# Patient Record
Sex: Female | Born: 1980
Health system: Southern US, Community
[De-identification: ages and names within clinical notes are randomized; demographics above are authoritative.]

## PROBLEM LIST (undated history)

## (undated) DIAGNOSIS — K643 Fourth degree hemorrhoids: Secondary | ICD-10-CM

## (undated) DIAGNOSIS — K5909 Other constipation: Secondary | ICD-10-CM

## (undated) DIAGNOSIS — G43909 Migraine, unspecified, not intractable, without status migrainosus: Secondary | ICD-10-CM

## (undated) DIAGNOSIS — Z9889 Other specified postprocedural states: Secondary | ICD-10-CM

## (undated) DIAGNOSIS — Z8489 Family history of other specified conditions: Secondary | ICD-10-CM

## (undated) DIAGNOSIS — E282 Polycystic ovarian syndrome: Secondary | ICD-10-CM

## (undated) DIAGNOSIS — R112 Nausea with vomiting, unspecified: Secondary | ICD-10-CM

## (undated) DIAGNOSIS — E785 Hyperlipidemia, unspecified: Secondary | ICD-10-CM

## (undated) HISTORY — PX: TONSILLECTOMY AND ADENOIDECTOMY: SUR1326

---

## 2001-10-10 HISTORY — PX: DILATION AND CURETTAGE OF UTERUS: SHX78

## 2008-10-10 HISTORY — PX: WISDOM TOOTH EXTRACTION: SHX21

## 2017-06-01 ENCOUNTER — Emergency Department
Admission: EM | Admit: 2017-06-01 | Discharge: 2017-06-01 | Disposition: A | Discharge status: Home / Self Care | Payer: 59 | Source: Home / Self Care | Attending: Family Medicine | Admitting: Family Medicine

## 2017-06-01 ENCOUNTER — Encounter: Payer: Self-pay | Admitting: Emergency Medicine

## 2017-06-01 DIAGNOSIS — L0201 Cutaneous abscess of face: Secondary | ICD-10-CM

## 2017-06-01 MED ORDER — DOXYCYCLINE HYCLATE 100 MG PO CAPS: 100.0000 mg | ORAL_CAPSULE | Freq: Two times a day (BID) | ORAL | 0 refills | Status: DC

## 2017-06-01 NOTE — ED Triage Notes (Signed)
Large acne on right side of chin x 1 month, took 10 days of Keflex about 3 weeks ago.

## 2017-06-01 NOTE — Discharge Instructions (Signed)
Leave bandage in place until follow-up visit tomorrow.  Keep wound clean and dry.

## 2017-06-01 NOTE — ED Provider Notes (Signed)
[facility]    CSN: [id number] Arrival date & time: 06/01/17  1854     History   Chief Complaint Chief Complaint  Patient presents with  . Acne    HPI Debra Rivas is a 36 y.o. female.   Patient reports that she developed an acne-like bump on her right chin about a month ago that gradually increased in size.  She had some left-over clindamycin 300mg , and took one twice daily for one week.  The lesion improved somewhat and drained purulent fluid but persisted.  She then visited a [facility] about 3 weeks ago where she was prescribed a 10 day course of Keflex 500mg  BID and topical mupirocin.  The lesion again drained purulent fluid but again increased in size.  She has continued to apply mupirocin ointment.  She feels well.  No fevers, chills, and sweats.   The history is provided by the patient.  Abscess  Location:  Face Facial abscess location:  Chin Size:  1.5cm Abscess quality: fluctuance and redness   Duration:  1 month Progression:  Unchanged Chronicity:  New Context: not skin injury   Relieved by:  Nothing Worsened by:  Draining/squeezing Ineffective treatments:  Draining/squeezing, oral antibiotics and topical antibiotics Associated symptoms: no fatigue and no fever     History reviewed. No pertinent past medical history.  There are no active problems to display for this patient.   History reviewed. No pertinent surgical history.  OB History    No data available       Home Medications    Prior to Admission medications   Medication Sig Start Date End Date Taking? Authorizing Provider  doxycycline (VIBRAMYCIN) 100 MG capsule Take 1 capsule (100 mg total) by mouth 2 (two) times daily. Take with food. 06/01/17   Lattie Haw, MD    Family History History reviewed. No pertinent family history.  Social History Social History  Substance Use Topics  . Smoking status: Never Smoker  . Smokeless tobacco: Never Used  . Alcohol use Yes      Allergies   Patient has no allergy information on record.   Review of Systems Review of Systems  Constitutional: Negative for fatigue and fever.  All other systems reviewed and are negative.    Physical Exam Triage Vital Signs ED Triage Vitals  Enc Vitals Group     BP 06/01/17 1911 121/81     Pulse Rate 06/01/17 1911 76     Resp --      Temp 06/01/17 1911 97.6 F (36.4 C)     Temp Source 06/01/17 1911 Oral     SpO2 06/01/17 1911 97 %     Weight 06/01/17 1912 130 lb (59 kg)     Height 06/01/17 1912 4\' 11"  (1.499 m)     Head Circumference --      Peak Flow --      Pain Score 06/01/17 1912 5     Pain Loc --      Pain Edu? --      Excl. in GC? --    No data found.   Updated Vital Signs BP 121/81 (BP Location: Left Arm)   Pulse 76   Temp 97.6 F (36.4 C) (Oral)   Ht 4\' 11"  (1.499 m)   Wt 130 lb (59 kg)   LMP 05/12/2017 (Exact Date)   SpO2 97%   BMI 26.26 kg/m   Visual Acuity Right Eye Distance:   Left Eye Distance:   Bilateral Distance:  Right Eye Near:   Left Eye Near:    Bilateral Near:     Physical Exam  Constitutional: She appears well-developed and well-nourished. No distress.  HENT:  Head: Atraumatic.    Right Ear: External ear normal.  Left Ear: External ear normal.  Nose: Nose normal.  Mouth/Throat: Oropharynx is clear and moist.  Right chin has 1.5cm by 1cm purpuric appearing cystic lesion, fluctuant but with minimal tenderness to palpation.  Eyes: Pupils are equal, round, and reactive to light. Conjunctivae are normal.  Neck: Neck supple.  Cardiovascular: Normal rate.   Pulmonary/Chest: Effort normal.  Lymphadenopathy:    She has no cervical adenopathy.  Neurological: She is alert.  Skin: Skin is warm and dry.  Nursing note and vitals reviewed.    UC Treatments / Results  Labs (all labs ordered are listed, but only abnormal results are displayed) Labs Reviewed  WOUND CULTURE    EKG  EKG Interpretation None        Radiology No results found.  Procedures Procedures  Incise and drain cyst/abscess Risks and benefits of procedure explained to patient and verbal consent obtained.  Using sterile technique, applied topical refrigerant spray followed by local anesthesia with 1% lidocaine with epinephrine, and cleansed affected area with Betadine and alcohol. Identified the most fluctuant area of lesion and incised with #11 blade.  Expressed blood and small amount of purulent material.  Inserted small length of Iodoform gauze packing to maintain wound patency.  Bandage applied.  Patient tolerated well   Medications Ordered in UC Medications - No data to display   Initial Impression / Assessment and Plan / UC Course  I have reviewed the triage vital signs and the nursing notes.  Pertinent labs & imaging results that were available during my care of the patient were reviewed by me and considered in my medical decision making (see chart for details).    Wound culture pending.  Begin empiric doxycycline 100mg  BID. Leave bandage in place until follow-up visit tomorrow.  Keep wound clean and dry.    Final Clinical Impressions(s) / UC Diagnoses   Final diagnoses:  Abscess of chin    New Prescriptions New Prescriptions   DOXYCYCLINE (VIBRAMYCIN) 100 MG CAPSULE    Take 1 capsule (100 mg total) by mouth 2 (two) times daily. Take with food.         Lattie Haw, MD 06/01/17 [phone]

## 2017-06-02 ENCOUNTER — Emergency Department (INDEPENDENT_AMBULATORY_CARE_PROVIDER_SITE_OTHER)
Admission: EM | Admit: 2017-06-02 | Discharge: 2017-06-02 | Disposition: A | Discharge status: Home / Self Care | Payer: 59 | Source: Home / Self Care | Attending: Family Medicine | Admitting: Family Medicine

## 2017-06-02 ENCOUNTER — Encounter: Payer: Self-pay | Admitting: Emergency Medicine

## 2017-06-02 DIAGNOSIS — Z5189 Encounter for other specified aftercare: Secondary | ICD-10-CM

## 2017-06-02 NOTE — ED Triage Notes (Signed)
Pt here for f/u of wound on right side of chin. Seen yesterday. No complaints. Taking abx as prescribed.

## 2017-06-02 NOTE — Discharge Instructions (Signed)
Keep wound bandaged and apply mupirocin ointment for 3 to 4 days.

## 2017-06-02 NOTE — ED Provider Notes (Signed)
[facility]    CSN: [id number] Arrival date & time: 06/02/17  1555     History   Chief Complaint Chief Complaint  Patient presents with  . Wound Check    HPI Debra Rivas is a 36 y.o. female.   Patient returns for dressing change of I and D site right chin.  She reports no pain.   The history is provided by the patient.    History reviewed. No pertinent past medical history.  There are no active problems to display for this patient.   History reviewed. No pertinent surgical history.  OB History    No data available       Home Medications    Prior to Admission medications   Medication Sig Start Date End Date Taking? Authorizing Provider  doxycycline (VIBRAMYCIN) 100 MG capsule Take 1 capsule (100 mg total) by mouth 2 (two) times daily. Take with food. 06/01/17   Lattie Haw, MD    Family History History reviewed. No pertinent family history.  Social History Social History  Substance Use Topics  . Smoking status: Never Smoker  . Smokeless tobacco: Never Used  . Alcohol use Yes     Allergies   Patient has no allergy information on record.   Review of Systems Review of Systems No fevers, chills, and sweats.   Physical Exam Triage Vital Signs ED Triage Vitals [06/02/17 1703]  Enc Vitals Group     BP      Pulse Rate 73     Resp      Temp 97.8 F (36.6 C)     Temp Source Oral     SpO2 98 %     Weight      Height      Head Circumference      Peak Flow      Pain Score 0     Pain Loc      Pain Edu?      Excl. in GC?    No data found.   Updated Vital Signs Pulse 73   Temp 97.8 F (36.6 C) (Oral)   LMP 05/12/2017 (Exact Date)   SpO2 98%   Visual Acuity Right Eye Distance:   Left Eye Distance:   Bilateral Distance:    Right Eye Near:   Left Eye Near:    Bilateral Near:     Physical Exam Nursing notes and Vital Signs reviewed. Appearance:  Patient appears stated age, and in no acute distress I and D site  right chin has minimal swelling and tenderness.  Packing removed; minimal drainage present. Minimal erythema.  No further packing indicated.  Dressing applied.  UC Treatments / Results  Labs (all labs ordered are listed, but only abnormal results are displayed) Labs Reviewed - No data to display  EKG  EKG Interpretation None       Radiology No results found.  Procedures Procedures (including critical care time)  Medications Ordered in UC Medications - No data to display   Initial Impression / Assessment and Plan / UC Course  I have reviewed the triage vital signs and the nursing notes.  Pertinent labs & imaging results that were available during my care of the patient were reviewed by me and considered in my medical decision making (see chart for details).    Wound healing well. Keep wound bandaged and apply mupirocin ointment for 3 to 4 days. Continue doxycycline. Followup with dermatologist or plastic surgeon if area remains nodular after healing.  Final Clinical Impressions(s) / UC Diagnoses   Final diagnoses:  Visit for wound check    New Prescriptions New Prescriptions   No medications on file       Lattie Haw, MD 06/05/17 [phone]

## 2017-06-03 ENCOUNTER — Telehealth: Payer: Self-pay | Admitting: Emergency Medicine

## 2017-06-03 NOTE — Telephone Encounter (Signed)
Spoke with patient states that she is doing well since her appointment, no problems or issues.  Any further questions or concerns, will follow up with PCP.

## 2017-06-04 LAB — WOUND CULTURE
GRAM STAIN: NONE SEEN
Gram Stain: NONE SEEN
Organism ID, Bacteria: NO GROWTH

## 2020-06-11 DIAGNOSIS — G43009 Migraine without aura, not intractable, without status migrainosus: Secondary | ICD-10-CM | POA: Diagnosis not present

## 2020-08-17 DIAGNOSIS — M25572 Pain in left ankle and joints of left foot: Secondary | ICD-10-CM | POA: Diagnosis not present

## 2020-08-17 DIAGNOSIS — M545 Low back pain, unspecified: Secondary | ICD-10-CM | POA: Diagnosis not present

## 2020-08-17 DIAGNOSIS — Y999 Unspecified external cause status: Secondary | ICD-10-CM | POA: Diagnosis not present

## 2020-08-17 DIAGNOSIS — M25462 Effusion, left knee: Secondary | ICD-10-CM | POA: Diagnosis not present

## 2020-08-17 DIAGNOSIS — M79602 Pain in left arm: Secondary | ICD-10-CM | POA: Diagnosis not present

## 2020-08-17 DIAGNOSIS — M79601 Pain in right arm: Secondary | ICD-10-CM | POA: Diagnosis not present

## 2020-08-17 DIAGNOSIS — S99912A Unspecified injury of left ankle, initial encounter: Secondary | ICD-10-CM | POA: Diagnosis not present

## 2020-09-02 DIAGNOSIS — S93402D Sprain of unspecified ligament of left ankle, subsequent encounter: Secondary | ICD-10-CM | POA: Diagnosis not present

## 2020-09-10 DIAGNOSIS — M25572 Pain in left ankle and joints of left foot: Secondary | ICD-10-CM | POA: Diagnosis not present

## 2020-11-09 DIAGNOSIS — Z13220 Encounter for screening for lipoid disorders: Secondary | ICD-10-CM | POA: Diagnosis not present

## 2020-11-09 DIAGNOSIS — Z0000 Encounter for general adult medical examination without abnormal findings: Secondary | ICD-10-CM | POA: Diagnosis not present

## 2020-11-09 DIAGNOSIS — Z131 Encounter for screening for diabetes mellitus: Secondary | ICD-10-CM | POA: Diagnosis not present

## 2020-12-21 DIAGNOSIS — K648 Other hemorrhoids: Secondary | ICD-10-CM | POA: Diagnosis not present

## 2020-12-21 DIAGNOSIS — K625 Hemorrhage of anus and rectum: Secondary | ICD-10-CM | POA: Diagnosis not present

## 2021-02-25 DIAGNOSIS — B354 Tinea corporis: Secondary | ICD-10-CM | POA: Diagnosis not present

## 2021-02-25 DIAGNOSIS — L709 Acne, unspecified: Secondary | ICD-10-CM | POA: Diagnosis not present

## 2021-03-09 ENCOUNTER — Ambulatory Visit: Payer: Self-pay | Admitting: Surgery

## 2021-03-09 DIAGNOSIS — E785 Hyperlipidemia, unspecified: Secondary | ICD-10-CM | POA: Diagnosis not present

## 2021-03-09 DIAGNOSIS — E282 Polycystic ovarian syndrome: Secondary | ICD-10-CM | POA: Diagnosis not present

## 2021-03-09 DIAGNOSIS — K643 Fourth degree hemorrhoids: Secondary | ICD-10-CM | POA: Diagnosis not present

## 2021-03-09 DIAGNOSIS — K648 Other hemorrhoids: Secondary | ICD-10-CM | POA: Diagnosis not present

## 2021-03-09 NOTE — H&P (Signed)
Debra Rivas Appointment: 03/09/2021 8:45 AM Location: [facility] Patient #: [id number] DOB: Aug 12, 1981 Single / Language: [place] / Race: White Female  History of Present Illness Debra Sportsman MD; 03/09/2021 9:30 AM) The patient is a 40 year old female who presents with hemorrhoids. Note for "Hemorrhoids": ` ` ` Patient sent for surgical consultation at the request of Debra Rivas, [place], [facility]  Chief Complaint: Hemorrhoids ` ` The patient is a pleasant woman estrone chronic constipation most for life. Usually goes once a week. Lately been trying to use stool softeners. Goes about twice a week now. She's had hemorrhoid issues in the past with bleeding and irritation. When she lived in [place], [place], she discussed with her primary care physician in [place] gastroenterology. They did a series of 3 banding's that helped somewhat. Recalls having a flexible sigmoidoscopy done at that time that was otherwise underwhelming. She's never had a colonoscopy. I cannot find record [name] She has at least one persistent hemorrhoid that is perhaps gotten worse. Stool softer to over to constipation but still gets bleeding. She had some episodes of pain irritation. Prescription for a suppository cost almost $300, so she held off. Surgical consultation offered after seeing gastroenterology.  No personal nor family history of GI/colon cancer, inflammatory bowel disease, irritable bowel syndrome, allergy such as Celiac Sprue, dietary/dairy problems, colitis, ulcers nor gastritis. No recent sick contacts/gastroenteritis. No travel outside the country. No changes in diet. No dysphagia to solids or liquids. No significant heartburn or reflux. No melena, hematemesis, coffee ground emesis. No evidence of prior gastric/peptic ulceration. Has polycystic ovary disease otherwise stable. Hypercholesterolemia.  (Review of systems as stated in this history (HPI) or in the review  of systems. Otherwise all other 12 point ROS are negative) ` ` ###########################################`  This patient encounter took 25 minutes today to perform the following: obtain history, perform exam, review outside records, interpret tests & imaging, counsel the patient on their diagnosis; and, document this encounter, including findings & plan in the electronic health record (EHR).   Past Surgical History Debra Rivas, CMA; 03/09/2021 9:00 AM) Oral Surgery Tonsillectomy  Diagnostic Studies History (Debra Rivas, CMA; 03/09/2021 9:00 AM) Colonoscopy never Pap Smear 1-5 years ago  Allergies (Debra Rivas, CMA; 03/09/2021 8:59 AM) No Known Drug Allergies [03/09/2021]: Allergies Reconciled  Medication History (Debra Rivas, CMA; 03/09/2021 9:02 AM) Biotin (1MG  Capsule, Oral) Active. Butalbital-Acetaminophen (25-325MG  Tablet, Oral) Active. Topiramate (15MG  Cap Sprinkle, Oral) Active. Vitamin D (400UNIT Capsule, Oral) Active. Clindamycin HCl (75MG  Capsule, Oral) Active. Diflucan (50MG  Tablet, Oral) Active. Hydrocortisone (1% Cream, External) Active. Simvastatin (10MG  Tablet, Oral) Active. Cyclobenzaprine HCl (5MG  Tablet, Oral) Active. Doxycycline Hyclate (100MG  Capsule, Oral) Active.  Social History (Debra Rivas, CMA; 03/09/2021 9:00 AM) Alcohol use Occasional alcohol use. Caffeine use Coffee. No drug use Tobacco use Never smoker.  Family History (Debra Rivas, CMA; 03/09/2021 9:00 AM) Colon Polyps Father. Depression Mother. Heart Disease Father. Hypertension Father, Mother.  Pregnancy / Birth History , CMA; 03/09/2021 9:00 AM) Gravida 1 Maternal age 14-25 Para 0 Regular periods  Other Problems (Debra Rivas, CMA; 03/09/2021 9:00 AM) No pertinent past medical history     Review of Systems (Debra Rivas CMA; 03/09/2021 9:00 AM) General Not Present- Appetite Loss, Chills, Fatigue, Fever, Night Sweats, Weight Gain and  Weight Loss. Skin Not Present- Change in Wart/Mole, Dryness, Hives, Jaundice, New Lesions, Non-Healing Wounds, Rash and Ulcer. HEENT Not Present- Earache, Hearing Loss, Hoarseness, Nose Bleed, Oral Ulcers, Ringing in the Ears, Seasonal Allergies,  Sinus Pain, Sore Throat, Visual Disturbances, Wears glasses/contact lenses and Yellow Eyes. Respiratory Not Present- Bloody sputum, Chronic Cough, Difficulty Breathing, Snoring and Wheezing. Breast Not Present- Breast Mass, Breast Pain, Nipple Discharge and Skin Changes. Cardiovascular Not Present- Chest Pain, Difficulty Breathing Lying Down, Leg Cramps, Palpitations, Rapid Heart Rate, Shortness of Breath and Swelling of Extremities. Gastrointestinal Present- Hemorrhoids. Not Present- Abdominal Pain, Bloating, Bloody Stool, Change in Bowel Habits, Chronic diarrhea, Constipation, Difficulty Swallowing, Excessive gas, Gets full quickly at meals, Indigestion, Nausea, Rectal Pain and Vomiting. Female Genitourinary Not Present- Frequency, Nocturia, Painful Urination, Pelvic Pain and Urgency. Musculoskeletal Present- Joint Pain. Not Present- Back Pain, Joint Stiffness, Muscle Pain, Muscle Weakness and Swelling of Extremities. Neurological Not Present- Decreased Memory, Fainting, Headaches, Numbness, Seizures, Tingling, Tremor, Trouble walking and Weakness. Psychiatric Not Present- Anxiety, Bipolar, Change in Sleep Pattern, Depression, Fearful and Frequent crying. Endocrine Not Present- Cold Intolerance, Excessive Hunger, Hair Changes, Heat Intolerance, Hot flashes and New Diabetes. Hematology Not Present- Blood Thinners, Easy Bruising, Excessive bleeding, Gland problems, HIV and Persistent Infections.  Vitals (Debra Rivas CMA; 03/09/2021 8:59 AM) 03/09/2021 8:58 AM Weight: 142.25 lb Height: 58.5in Body Surface Area: 1.59 m Body Mass Index: 29.22 kg/m  Temp.: 98.89F  Pulse: 99 (Regular)  P.OX: 99% (Room air) BP: 112/70(Sitting, Left Arm,  Standard)        Physical Exam Debra Sportsman MD; 03/09/2021 9:18 AM)  General Mental Status-Alert. General Appearance-Not in acute distress, Not Sickly. Orientation-Oriented X3. Hydration-Well hydrated. Voice-Normal.  Integumentary Global Assessment Upon inspection and palpation of skin surfaces of the - Axillae: non-tender, no inflammation or ulceration, no drainage. and Distribution of scalp and body hair is normal. General Characteristics Temperature - normal warmth is noted.  Head and Neck Head-normocephalic, atraumatic with no lesions or palpable masses. Face Global Assessment - atraumatic, no absence of expression. Neck Global Assessment - no abnormal movements, no bruit auscultated on the right, no bruit auscultated on the left, no decreased range of motion, non-tender. Trachea-midline. Thyroid Gland Characteristics - non-tender.  Eye Eyeball - Left-Extraocular movements intact, No Nystagmus - Left. Eyeball - Right-Extraocular movements intact, No Nystagmus - Right. Cornea - Left-No Hazy - Left. Cornea - Right-No Hazy - Right. Sclera/Conjunctiva - Left-No scleral icterus, No Discharge - Left. Sclera/Conjunctiva - Right-No scleral icterus, No Discharge - Right. Pupil - Left-Direct reaction to light normal. Pupil - Right-Direct reaction to light normal.  ENMT Ears Pinna - Left - no drainage observed, no generalized tenderness observed. Pinna - Right - no drainage observed, no generalized tenderness observed. Nose and Sinuses External Inspection of the Nose - no destructive lesion observed. Inspection of the nares - Left - quiet respiration. Inspection of the nares - Right - quiet respiration. Mouth and Throat Lips - Upper Lip - no fissures observed, no pallor noted. Lower Lip - no fissures observed, no pallor noted. Nasopharynx - no discharge present. Oral Cavity/Oropharynx - Tongue - no dryness observed. Oral Mucosa - no cyanosis  observed. Hypopharynx - no evidence of airway distress observed. Note: Moderate ear stud piercing. Clean without inflammation or irritation  Chest and Lung Exam Inspection Movements - Normal and Symmetrical. Accessory muscles - No use of accessory muscles in breathing. Palpation Palpation of the chest reveals - Non-tender. Auscultation Breath sounds - Normal and Clear.  Cardiovascular Auscultation Rhythm - Regular. Murmurs & Other Heart Sounds - Auscultation of the heart reveals - No Murmurs and No Systolic Clicks.  Abdomen Inspection Inspection of the abdomen reveals - No Visible peristalsis  and No Abnormal pulsations. Umbilicus - No Bleeding, No Urine drainage. Palpation/Percussion Palpation and Percussion of the abdomen reveal - Soft, Non Tender, No Rebound tenderness, No Rigidity (guarding) and No Cutaneous hyperesthesia. Note: Abdomen soft. Not severely distended. No distasis recti. No umbilical or other anterior abdominal wall hernias  Female Genitourinary Sexual Maturity Tanner 5 - Adult hair pattern. Note: No vaginal bleeding nor discharge  Rectal Note: #################################  Large right anterior prolapsed hemorrhoid  Perianal skin clean with good hygiene. No pruritis ani. No pilonidal disease. No fissure. No abscess/fistula. Normal sphincter tone. No other external hemorrhoids. No condyloma warts.  Tolerates digital and anoscopic rectal exam. No rectal masses. Hemorrhoidal piles right posterior with scarring consistent with prior banding. Left lateral mildly irritated grade 2. Exam done with assistance of female Medical Assistant in the room.  Peripheral Vascular Upper Extremity Inspection - Left - No Cyanotic nailbeds - Left, Not Ischemic. Inspection - Right - No Cyanotic nailbeds - Right, Not Ischemic.  Neurologic Neurologic evaluation reveals -normal attention span and ability to concentrate, able to name objects and repeat phrases.  Appropriate fund of knowledge , normal sensation and normal coordination. Mental Status Affect - not angry, not paranoid. Cranial Nerves-Normal Bilaterally. Gait-Normal.  Neuropsychiatric Mental status exam performed with findings of-able to articulate well with normal speech/language, rate, volume and coherence, thought content normal with ability to perform basic computations and apply abstract reasoning and no evidence of hallucinations, delusions, obsessions or homicidal/suicidal ideation.  Musculoskeletal Global Assessment Spine, Ribs and Pelvis - no instability, subluxation or laxity. Right Upper Extremity - no instability, subluxation or laxity.  Lymphatic Head & Neck  General Head & Neck Lymphatics: Bilateral - Description - No Localized lymphadenopathy. Axillary  General Axillary Region: Bilateral - Description - No Localized lymphadenopathy. Femoral & Inguinal  Generalized Femoral & Inguinal Lymphatics: Left - Description - No Localized lymphadenopathy. Right - Description - No Localized lymphadenopathy.    Assessment & Plan Debra Sportsman MD; 03/09/2021 9:30 AM)  PROLAPSED INTERNAL HEMORRHOIDS, GRADE 4 (K64.3) Impression: Large right anterior chronically prolapsed hemorrhoid with bleeding and irritation in the setting of chronic constipation.  She's already had a few Band-Aids done last decade. I think that reflects her right posterior scarred in area. This too far along to manage with banding alone. I think she requires surgical treatment with hemorrhoidectomy and ligation and pexy overall. Outpatient surgery. She is interested in proceeding. I did caution him that people struggle with severe pain in the first few weeks and then it starts to taper off after a few months. She understands and wishes to proceed.  I strongly noted that this is a side effect of her chronic constipation and she needs to be in the supplement every day and adjust so that she is going every  day to minimize the chance of new hemorrhoids and also help her recover over the surgery more easily. Previous. We give information to help  Current Plans ANOSCOPY, DIAGNOSTIC (00174)  INTERNAL BLEEDING HEMORRHOIDS (K64.8) Impression: The anatomy & physiology of the anorectal region was discussed. The pathophysiology of hemorrhoids and differential diagnosis was discussed. Natural history progression was discussed. I stressed the importance of a bowel regimen to have daily soft bowel movements to minimize progression of disease. Goal of one BM / day ideal. Use of wet wipes, warm baths, avoiding straining, etc were emphasized.  Educational handouts further explaining the pathology, treatment options, and bowel regimen were given as well. The patient expressed understanding.  Current Plans Pt  Education - Pamphlet Given - The Hemorrhoid Book: discussed with patient and provided information. The anatomy & physiology of the anorectal region was discussed. The pathophysiology of hemorrhoids and differential diagnosis was discussed. Natural history risks without surgery was discussed. I stressed the importance of a bowel regimen to have daily soft bowel movements to minimize progression of disease. Interventions such as sclerotherapy & banding were discussed.  The patient's symptoms are not adequately controlled by medicines and other non-operative treatments. I feel the risks & problems of no surgery outweigh the operative risks; therefore, I recommended surgery to treat the hemorrhoids by ligation, pexy, and possible resection.  Risks such as bleeding, infection, urinary difficulties, need for further treatment, heart attack, death, and other risks were discussed. I noted a good likelihood this will help address the problem. Goals of post-operative recovery were discussed as well. Possibility that this will not correct all symptoms was explained. Post-operative pain, bleeding, constipation,  and other problems after surgery were discussed. We will work to minimize complications. Educational handouts further explaining the pathology, treatment options, and bowel regimen were given as well. Questions were answered. The patient expresses understanding & wishes to proceed with surgery.  Pt Education - [facility] Hemorrhoids (Jaelani Posa): discussed with patient and provided information.  ENCOUNTER FOR PREOPERATIVE EXAMINATION FOR GENERAL SURGICAL PROCEDURE (Z01.818)  Current Plans You are being scheduled for surgery- Our schedulers will call you.  You should hear from our office's scheduling department within 5 working days about the location, date, and time of surgery. We try to make accommodations for patient's preferences in scheduling surgery, but sometimes the OR schedule or the surgeon's schedule prevents us from making those accommodations.  If you have not heard from our office [phone]) in 5 working days, call the office and ask for your surgeon's nurse.  If you have other questions about your diagnosis, plan, or surgery, call the office and ask for your surgeon's nurse.  Pt Education - [facility] Rectal Prep for Anorectal outpatient/office surgery: discussed with patient and provided information. Pt Education - [facility] Rectal Surgery HCI (Devarius Nelles): discussed with patient and provided information.  PCOS (POLYCYSTIC OVARIAN SYNDROME) (E28.2)   HYPERLIPEMIA (E78.5)  Debra SportsmanSteven C. Mailani Degroote, MD, FACS, MASCRS  Esophageal, Gastrointestinal & Colorectal Surgery Robotic and Minimally Invasive Surgery [facility] [address] [place] [id number] [phone] Fax [phone] Main/Paging  CONTACT INFORMATION: Weekday (9AM-5PM) concerns: Call [facility] main office at [phone] Weeknight (5PM-9AM) or Weekend/Holiday concerns: Check [email or web address] for General Surgery [facility] coverage (Please, do not use SecureChat as it is not reliable communication to operating  surgeons for immediate patient care)     '

## 2021-04-13 ENCOUNTER — Other Ambulatory Visit: Payer: Self-pay

## 2021-04-13 ENCOUNTER — Encounter (HOSPITAL_BASED_OUTPATIENT_CLINIC_OR_DEPARTMENT_OTHER): Payer: Self-pay | Admitting: Surgery

## 2021-04-13 NOTE — Progress Notes (Signed)
Spoke w/ via phone for pre-op interview--- Pt Lab needs dos----  Urine preg             Lab results------ no COVID test -----patient states asymptomatic no test needed Arrive at ------- 0700 on 04-15-2021 NPO after MN NO Solid Food.  Clear liquids from MN until--- 0600 Med rec completed Medications to take morning of surgery ----- NONE Diabetic medication ----- n/a Patient instructed no nail polish to be worn day of surgery Patient instructed to bring photo id and insurance card day of surgery Patient aware to have Driver (ride ) / caregiver for 24 hours after surgery -- mother, Debra Rivas Patient Special Instructions ----- per unable to come pick up ensure pre-surgery drink Pre-Op special Istructions ----- n/a Patient verbalized understanding of instructions that were given at this phone interview. Patient denies shortness of breath, chest pain, fever, cough at this phone interview.

## 2021-04-15 ENCOUNTER — Encounter (HOSPITAL_BASED_OUTPATIENT_CLINIC_OR_DEPARTMENT_OTHER)
Admission: RE | Disposition: A | Discharge status: Home / Self Care | Payer: Self-pay | Source: Home / Self Care | Attending: Surgery

## 2021-04-15 ENCOUNTER — Ambulatory Visit (HOSPITAL_BASED_OUTPATIENT_CLINIC_OR_DEPARTMENT_OTHER): Payer: BC Managed Care – PPO | Admitting: Anesthesiology

## 2021-04-15 ENCOUNTER — Encounter (HOSPITAL_BASED_OUTPATIENT_CLINIC_OR_DEPARTMENT_OTHER): Payer: Self-pay | Admitting: Surgery

## 2021-04-15 ENCOUNTER — Ambulatory Visit (HOSPITAL_BASED_OUTPATIENT_CLINIC_OR_DEPARTMENT_OTHER)
Admission: RE | Admit: 2021-04-15 | Discharge: 2021-04-15 | Disposition: A | Discharge status: Home / Self Care | Payer: BC Managed Care – PPO | Attending: Surgery | Admitting: Surgery

## 2021-04-15 ENCOUNTER — Other Ambulatory Visit: Payer: Self-pay

## 2021-04-15 DIAGNOSIS — K5909 Other constipation: Secondary | ICD-10-CM | POA: Insufficient documentation

## 2021-04-15 DIAGNOSIS — E282 Polycystic ovarian syndrome: Secondary | ICD-10-CM | POA: Insufficient documentation

## 2021-04-15 DIAGNOSIS — K641 Second degree hemorrhoids: Secondary | ICD-10-CM | POA: Diagnosis not present

## 2021-04-15 DIAGNOSIS — E7800 Pure hypercholesterolemia, unspecified: Secondary | ICD-10-CM | POA: Insufficient documentation

## 2021-04-15 DIAGNOSIS — E785 Hyperlipidemia, unspecified: Secondary | ICD-10-CM | POA: Insufficient documentation

## 2021-04-15 DIAGNOSIS — K643 Fourth degree hemorrhoids: Secondary | ICD-10-CM | POA: Insufficient documentation

## 2021-04-15 DIAGNOSIS — G43909 Migraine, unspecified, not intractable, without status migrainosus: Secondary | ICD-10-CM | POA: Diagnosis not present

## 2021-04-15 DIAGNOSIS — Z79899 Other long term (current) drug therapy: Secondary | ICD-10-CM | POA: Insufficient documentation

## 2021-04-15 DIAGNOSIS — K644 Residual hemorrhoidal skin tags: Secondary | ICD-10-CM | POA: Diagnosis not present

## 2021-04-15 HISTORY — DX: Hyperlipidemia, unspecified: E78.5

## 2021-04-15 HISTORY — DX: Family history of other specified conditions: Z84.89

## 2021-04-15 HISTORY — DX: Fourth degree hemorrhoids: K64.3

## 2021-04-15 HISTORY — DX: Polycystic ovarian syndrome: E28.2

## 2021-04-15 HISTORY — DX: Other constipation: K59.09

## 2021-04-15 HISTORY — PX: RECTAL EXAM UNDER ANESTHESIA: SHX6399

## 2021-04-15 HISTORY — DX: Other specified postprocedural states: Z98.890

## 2021-04-15 HISTORY — DX: Other specified postprocedural states: R11.2

## 2021-04-15 HISTORY — PX: HEMORRHOID SURGERY: SHX153

## 2021-04-15 HISTORY — DX: Migraine, unspecified, not intractable, without status migrainosus: G43.909

## 2021-04-15 LAB — POCT PREGNANCY, URINE: Preg Test, Ur: NEGATIVE

## 2021-04-15 SURGERY — HEMORRHOIDECTOMY PROLAPSED
Anesthesia: General | Site: Rectum

## 2021-04-15 MED ORDER — ONDANSETRON HCL 4 MG/2ML IJ SOLN
INTRAMUSCULAR | Status: AC
  Filled 2021-04-15: qty 2

## 2021-04-15 MED ORDER — ROCURONIUM BROMIDE 100 MG/10ML IV SOLN
INTRAVENOUS | Status: DC | PRN
  Administered 2021-04-15: 50 mg via INTRAVENOUS

## 2021-04-15 MED ORDER — FENTANYL CITRATE (PF) 100 MCG/2ML IJ SOLN
INTRAMUSCULAR | Status: AC
  Filled 2021-04-15: qty 2

## 2021-04-15 MED ORDER — DEXAMETHASONE SODIUM PHOSPHATE 10 MG/ML IJ SOLN
INTRAMUSCULAR | Status: AC
  Filled 2021-04-15: qty 1

## 2021-04-15 MED ORDER — ONDANSETRON HCL 4 MG PO TABS: 4.0000 mg | ORAL_TABLET | Freq: Three times a day (TID) | ORAL | 5 refills | Status: AC | PRN

## 2021-04-15 MED ORDER — CELECOXIB 200 MG PO CAPS
ORAL_CAPSULE | ORAL | Status: AC
  Filled 2021-04-15: qty 1

## 2021-04-15 MED ORDER — SCOPOLAMINE 1 MG/3DAYS TD PT72
1.0000 | MEDICATED_PATCH | TRANSDERMAL | Status: DC
  Administered 2021-04-15: 1.5 mg via TRANSDERMAL

## 2021-04-15 MED ORDER — METRONIDAZOLE 500 MG/100ML IV SOLN
INTRAVENOUS | Status: AC
  Filled 2021-04-15: qty 100

## 2021-04-15 MED ORDER — ACETAMINOPHEN 500 MG PO TABS
1000.0000 mg | ORAL_TABLET | Freq: Once | ORAL | Status: AC
  Administered 2021-04-15: 1000 mg via ORAL

## 2021-04-15 MED ORDER — SODIUM CHLORIDE 0.9 % IV SOLN
INTRAVENOUS | Status: AC
  Filled 2021-04-15: qty 100

## 2021-04-15 MED ORDER — LIDOCAINE HCL (PF) 2 % IJ SOLN
INTRAMUSCULAR | Status: AC
  Filled 2021-04-15: qty 5

## 2021-04-15 MED ORDER — ACETAMINOPHEN 500 MG PO TABS
ORAL_TABLET | ORAL | Status: AC
  Filled 2021-04-15: qty 2

## 2021-04-15 MED ORDER — CEFTRIAXONE SODIUM 2 G IJ SOLR
INTRAMUSCULAR | Status: AC
  Filled 2021-04-15: qty 20

## 2021-04-15 MED ORDER — DIBUCAINE 1 % EX OINT
TOPICAL_OINTMENT | CUTANEOUS | Status: DC | PRN
  Administered 2021-04-15: 1

## 2021-04-15 MED ORDER — LIDOCAINE HCL (CARDIAC) PF 100 MG/5ML IV SOSY
PREFILLED_SYRINGE | INTRAVENOUS | Status: DC | PRN
  Administered 2021-04-15: 60 mg via INTRAVENOUS

## 2021-04-15 MED ORDER — BUPIVACAINE-EPINEPHRINE 0.25% -1:200000 IJ SOLN
INTRAMUSCULAR | Status: DC | PRN
  Administered 2021-04-15: 20 mL

## 2021-04-15 MED ORDER — ACETAMINOPHEN 500 MG PO TABS: 1000.0000 mg | ORAL_TABLET | ORAL | Status: DC

## 2021-04-15 MED ORDER — GABAPENTIN 300 MG PO CAPS
ORAL_CAPSULE | ORAL | Status: AC
  Filled 2021-04-15: qty 1

## 2021-04-15 MED ORDER — DIAZEPAM 5 MG PO TABS: 5.0000 mg | ORAL_TABLET | Freq: Four times a day (QID) | ORAL | 2 refills | Status: AC | PRN

## 2021-04-15 MED ORDER — FENTANYL CITRATE (PF) 100 MCG/2ML IJ SOLN
25.0000 ug | INTRAMUSCULAR | Status: DC | PRN
  Administered 2021-04-15 (×2): 25 ug via INTRAVENOUS

## 2021-04-15 MED ORDER — CELECOXIB 200 MG PO CAPS
200.0000 mg | ORAL_CAPSULE | ORAL | Status: AC
  Administered 2021-04-15: 200 mg via ORAL

## 2021-04-15 MED ORDER — PROPOFOL 10 MG/ML IV BOLUS
INTRAVENOUS | Status: DC | PRN
  Administered 2021-04-15: 30 mg via INTRAVENOUS
  Administered 2021-04-15: 50 mg via INTRAVENOUS
  Administered 2021-04-15: 150 mg via INTRAVENOUS

## 2021-04-15 MED ORDER — ENSURE PRE-SURGERY PO LIQD: 296.0000 mL | Freq: Once | ORAL | Status: DC

## 2021-04-15 MED ORDER — FENTANYL CITRATE (PF) 100 MCG/2ML IJ SOLN
INTRAMUSCULAR | Status: DC | PRN
  Administered 2021-04-15 (×3): 25 ug via INTRAVENOUS
  Administered 2021-04-15: 50 ug via INTRAVENOUS
  Administered 2021-04-15: 25 ug via INTRAVENOUS
  Administered 2021-04-15: 50 ug via INTRAVENOUS
  Administered 2021-04-15 (×2): 25 ug via INTRAVENOUS

## 2021-04-15 MED ORDER — GABAPENTIN 300 MG PO CAPS
300.0000 mg | ORAL_CAPSULE | ORAL | Status: AC
  Administered 2021-04-15: 300 mg via ORAL

## 2021-04-15 MED ORDER — PROPOFOL 10 MG/ML IV BOLUS
INTRAVENOUS | Status: AC
  Filled 2021-04-15: qty 20

## 2021-04-15 MED ORDER — DIAZEPAM 5 MG PO TABS
ORAL_TABLET | ORAL | Status: AC
  Filled 2021-04-15: qty 1

## 2021-04-15 MED ORDER — CHLORHEXIDINE GLUCONATE CLOTH 2 % EX PADS: 6.0000 | MEDICATED_PAD | Freq: Once | CUTANEOUS | Status: DC

## 2021-04-15 MED ORDER — SUGAMMADEX SODIUM 200 MG/2ML IV SOLN
INTRAVENOUS | Status: DC | PRN
  Administered 2021-04-15: 200 mg via INTRAVENOUS

## 2021-04-15 MED ORDER — ROCURONIUM BROMIDE 10 MG/ML (PF) SYRINGE
PREFILLED_SYRINGE | INTRAVENOUS | Status: AC
  Filled 2021-04-15: qty 10

## 2021-04-15 MED ORDER — SCOPOLAMINE 1 MG/3DAYS TD PT72
MEDICATED_PATCH | TRANSDERMAL | Status: AC
  Filled 2021-04-15: qty 1

## 2021-04-15 MED ORDER — MIDAZOLAM HCL 2 MG/2ML IJ SOLN
INTRAMUSCULAR | Status: AC
  Filled 2021-04-15: qty 2

## 2021-04-15 MED ORDER — BUPIVACAINE LIPOSOME 1.3 % IJ SUSP
INTRAMUSCULAR | Status: DC | PRN
  Administered 2021-04-15: 20 mL

## 2021-04-15 MED ORDER — METRONIDAZOLE 500 MG/100ML IV SOLN
500.0000 mg | INTRAVENOUS | Status: AC
  Administered 2021-04-15: 500 mg via INTRAVENOUS

## 2021-04-15 MED ORDER — ONDANSETRON HCL 4 MG/2ML IJ SOLN
INTRAMUSCULAR | Status: DC | PRN
  Administered 2021-04-15: 4 mg via INTRAVENOUS

## 2021-04-15 MED ORDER — SODIUM CHLORIDE 0.9 % IV SOLN: INTRAVENOUS | Status: DC

## 2021-04-15 MED ORDER — MIDAZOLAM HCL 5 MG/5ML IJ SOLN
INTRAMUSCULAR | Status: DC | PRN
  Administered 2021-04-15: 2 mg via INTRAVENOUS

## 2021-04-15 MED ORDER — BUPIVACAINE LIPOSOME 1.3 % IJ SUSP: 20.0000 mL | Freq: Once | INTRAMUSCULAR | Status: DC

## 2021-04-15 MED ORDER — DEXAMETHASONE SODIUM PHOSPHATE 4 MG/ML IJ SOLN
INTRAMUSCULAR | Status: DC | PRN
  Administered 2021-04-15: 5 mg via INTRAVENOUS

## 2021-04-15 MED ORDER — SODIUM CHLORIDE 0.9 % IV SOLN
2.0000 g | INTRAVENOUS | Status: AC
  Administered 2021-04-15: 2 g via INTRAVENOUS

## 2021-04-15 MED ORDER — TRAMADOL HCL 50 MG PO TABS: 50.0000 mg | ORAL_TABLET | Freq: Four times a day (QID) | ORAL | 0 refills | Status: AC | PRN

## 2021-04-15 MED ORDER — DIAZEPAM 5 MG PO TABS
5.0000 mg | ORAL_TABLET | Freq: Once | ORAL | Status: AC
  Administered 2021-04-15: 5 mg via ORAL

## 2021-04-15 SURGICAL SUPPLY — 67 items
APL SKNCLS STERI-STRIP NONHPOA (GAUZE/BANDAGES/DRESSINGS) ×2
BENZOIN TINCTURE PRP APPL 2/3 (GAUZE/BANDAGES/DRESSINGS) ×3 IMPLANT
BLADE CLIPPER SENSICLIP SURGIC (BLADE) IMPLANT
BLADE HEX COATED 2.75 (ELECTRODE) ×3 IMPLANT
BLADE SURG 10 STRL SS (BLADE) IMPLANT
BLADE SURG 15 STRL LF DISP TIS (BLADE) ×2 IMPLANT
BLADE SURG 15 STRL SS (BLADE) ×3
BRIEF STRETCH FOR OB PAD LRG (UNDERPADS AND DIAPERS) ×3 IMPLANT
CANISTER SUCT 1200ML W/VALVE (MISCELLANEOUS) ×3 IMPLANT
COVER BACK TABLE 60X90IN (DRAPES) ×3 IMPLANT
COVER MAYO STAND STRL (DRAPES) ×3 IMPLANT
DECANTER SPIKE VIAL GLASS SM (MISCELLANEOUS) IMPLANT
DRAPE HYSTEROSCOPY (MISCELLANEOUS) ×3 IMPLANT
DRAPE LAPAROTOMY 100X72 PEDS (DRAPES) ×3 IMPLANT
DRAPE SHEET LG 3/4 BI-LAMINATE (DRAPES) IMPLANT
DRSG PAD ABDOMINAL 8X10 ST (GAUZE/BANDAGES/DRESSINGS) ×3 IMPLANT
ELECT NEEDLE TIP 2.8 STRL (NEEDLE) IMPLANT
ELECT REM PT RETURN 9FT ADLT (ELECTROSURGICAL) ×3
ELECTRODE REM PT RTRN 9FT ADLT (ELECTROSURGICAL) ×2 IMPLANT
FILTER STRAW (MISCELLANEOUS) ×3 IMPLANT
GAUZE SPONGE 4X4 12PLY STRL (GAUZE/BANDAGES/DRESSINGS) IMPLANT
GAUZE SPONGE 4X4 12PLY STRL LF (GAUZE/BANDAGES/DRESSINGS) ×3 IMPLANT
GLOVE SRG 8 PF TXTR STRL LF DI (GLOVE) ×2 IMPLANT
GLOVE SURG LTX SZ8 (GLOVE) ×3 IMPLANT
GLOVE SURG UNDER POLY LF SZ8 (GLOVE) ×3
GOWN STRL REUS W/TWL XL LVL3 (GOWN DISPOSABLE) ×3 IMPLANT
IV CATH 14GX2 1/4 (CATHETERS) IMPLANT
IV CATH PLACEMENT 20 GA (IV SOLUTION) IMPLANT
KIT SIGMOIDOSCOPE (SET/KITS/TRAYS/PACK) IMPLANT
KIT TURNOVER CYSTO (KITS) ×3 IMPLANT
LEGGING LITHOTOMY PAIR STRL (DRAPES) IMPLANT
LOOP VESSEL MAXI BLUE (MISCELLANEOUS) IMPLANT
NEEDLE HYPO 22GX1.5 SAFETY (NEEDLE) ×3 IMPLANT
NS IRRIG 500ML POUR BTL (IV SOLUTION) ×3 IMPLANT
PACK BASIN DAY SURGERY FS (CUSTOM PROCEDURE TRAY) ×3 IMPLANT
PAD PREP 24X48 CUFFED NSTRL (MISCELLANEOUS) ×3 IMPLANT
PANTS MESH DISP LRG (UNDERPADS AND DIAPERS) ×2 IMPLANT
PANTS MESH DISPOSABLE L (UNDERPADS AND DIAPERS) ×1
PENCIL SMOKE EVACUATOR (MISCELLANEOUS) ×3 IMPLANT
SCRUB TECHNI CARE 4 OZ NO DYE (MISCELLANEOUS) ×3 IMPLANT
SHEARS HARMONIC 9CM CVD (BLADE) IMPLANT
SURGILUBE 2OZ TUBE FLIPTOP (MISCELLANEOUS) ×3 IMPLANT
SUT CHROMIC 2 0 SH (SUTURE) ×3 IMPLANT
SUT CHROMIC 3 0 SH 27 (SUTURE) ×3 IMPLANT
SUT ETHIBOND 0 (SUTURE) IMPLANT
SUT MNCRL AB 4-0 PS2 18 (SUTURE) IMPLANT
SUT PROLENE 2 0 SH DA (SUTURE) IMPLANT
SUT VIC AB 2-0 SH 27 (SUTURE)
SUT VIC AB 2-0 SH 27XBRD (SUTURE) IMPLANT
SUT VIC AB 2-0 UR6 27 (SUTURE) ×18 IMPLANT
SUT VIC AB 3-0 SH 18 (SUTURE) IMPLANT
SUT VICRYL 0 UR6 27IN ABS (SUTURE) IMPLANT
SUT VICRYL AB 2 0 TIE (SUTURE) IMPLANT
SUT VICRYL AB 2 0 TIES (SUTURE)
SWAB COLLECTION DEVICE MRSA (MISCELLANEOUS) IMPLANT
SWAB CULTURE ESWAB REG 1ML (MISCELLANEOUS) IMPLANT
SYR 20ML LL LF (SYRINGE) ×6 IMPLANT
SYR 27GX1/2 1ML LL SAFETY (SYRINGE) ×3 IMPLANT
SYR BULB IRRIG 60ML STRL (SYRINGE) ×3 IMPLANT
SYR CONTROL 10ML LL (SYRINGE) IMPLANT
TAPE CLOTH 3X10 TAN LF (GAUZE/BANDAGES/DRESSINGS) ×3 IMPLANT
TOWEL OR 17X26 10 PK STRL BLUE (TOWEL DISPOSABLE) ×6 IMPLANT
TRAY DSU PREP LF (CUSTOM PROCEDURE TRAY) ×3 IMPLANT
TUBE CONNECTING 12X1/4 (SUCTIONS) ×3 IMPLANT
UNDERPAD 30X36 HEAVY ABSORB (UNDERPADS AND DIAPERS) ×3 IMPLANT
WATER STERILE IRR 500ML POUR (IV SOLUTION) ×3 IMPLANT
YANKAUER SUCT BULB TIP NO VENT (SUCTIONS) ×3 IMPLANT

## 2021-04-15 NOTE — Anesthesia Postprocedure Evaluation (Signed)
Anesthesia Post Note  Patient: Debra Rivas  Procedure(s) Performed: HEMORRHOIDECTOMY WITH LIGATION AND HEMORRHOIDOPEXY/INTERNAL AND EXTERNAL (Rectum) ANORECTAL EXAMINATION UNDER ANESTHESIA     Patient location during evaluation: PACU Anesthesia Type: General Level of consciousness: awake Pain management: pain level controlled Vital Signs Assessment: post-procedure vital signs reviewed and stable Respiratory status: spontaneous breathing Cardiovascular status: stable Postop Assessment: no apparent nausea or vomiting Anesthetic complications: no   No notable events documented.  Last Vitals:  Vitals:   04/15/21 1115 04/15/21 1130  BP: 114/78 106/72  Pulse: 72 74  Resp: 12 11  Temp:    SpO2: 99% 94%    Last Pain:  Vitals:   04/15/21 1120  TempSrc:   PainSc: 5                  Marquett Bertoli

## 2021-04-15 NOTE — H&P (Signed)
Otto Herbmanda Fehnel  DOB: 1980/10/17 Single / Language: [place] / Race: White Female   Patient Care Team: Mitzi Hansenaniel, Janice, NP as PCP - General (Nurse Practitioner) Karie SodaGross, Mayetta Castleman, MD as Consulting Physician (General Surgery) Loleta RoseBaron-Johnson, Alison, PA-C as Physician Assistant (Gastroenterology)  Marland Kitchen` ` Patient sent for surgical consultation at the request of Everlene BallsAlison Johnson, PA, [facility]   Chief Complaint: Hemorrhoids ` ` The patient is a pleasant woman estrone chronic constipation most for life.  Usually goes once a week.  Lately been trying to use stool softeners.  Goes about twice a week now.  She's had hemorrhoid issues in the past with bleeding and irritation.  [place] she lived in [place] , she discussed with [place] primary care physician in [place].  They did a series of 3 banding's that helped somewhat.  Recalls having a flexible sigmoidoscopy done at that time that was otherwise underwhelming.  She's never had a colonoscopy.  I cannot find record [place] She has at least one persistent hemorrhoid that is perhaps gotten worse.  Stool softer to over to constipation but still gets bleeding.  She had some episodes of pain irritation.  Prescription for a suppository cost almost $300, so she held off.  Surgical consultation offered after seeing gastroenterology.   No personal nor family history of GI/colon cancer, inflammatory bowel disease, irritable bowel syndrome, allergy such as Celiac Sprue, dietary/dairy problems, colitis, ulcers nor gastritis.  No recent sick contacts/gastroenteritis.  No travel outside the country.  No changes in diet.  No dysphagia to solids or liquids.  No significant heartburn or reflux.  No melena, hematemesis, coffee ground emesis.  No evidence of prior gastric/peptic ulceration.  Has polycystic ovary disease otherwise stable.  Hypercholesterolemia.   (Review of systems as stated in this history (HPI) or in the review of systems.  Otherwise all other  12 point ROS are negative) ` ` ###########################################`   This patient encounter took 25 minutes today to perform the following: obtain history, perform exam, review outside records, interpret tests & imaging, counsel the patient on their diagnosis; and, document this encounter, including findings & plan in the electronic health record (EHR).     Past Surgical History Ezequiel Essex([name], CMA; 03/09/2021 9:00 AM) Oral Surgery  Tonsillectomy    Diagnostic Studies History ([name], CMA; 03/09/2021 9:00 AM) Colonoscopy  never Pap Smear  1-5 years ago   Allergies ([name], CMA; 03/09/2021 8:59 AM) No Known Drug Allergies  [03/09/2021]: Allergies Reconciled    Medication History ([name], CMA; 03/09/2021 9:02 AM) Biotin  (1MG  Capsule, Oral) Active. Butalbital-Acetaminophen  (25-325MG  Tablet, Oral) Active. Topiramate  (15MG  Cap Sprinkle, Oral) Active. Vitamin D  (400UNIT Capsule, Oral) Active. Clindamycin HCl  (75MG  Capsule, Oral) Active. Diflucan  (50MG  Tablet, Oral) Active. Hydrocortisone  (1% Cream, External) Active. Simvastatin  (10MG  Tablet, Oral) Active. Cyclobenzaprine HCl  (5MG  Tablet, Oral) Active. Doxycycline Hyclate  (100MG  Capsule, Oral) Active.   Social History ([name], CMA; 03/09/2021 9:00 AM) Alcohol use  Occasional alcohol use. Caffeine use  Coffee. No drug use  Tobacco use  Never smoker.   Family History ([name], CMA; 03/09/2021 9:00 AM) Colon Polyps  Father. Depression  Mother. Heart Disease  Father. Hypertension  Father, Mother.   Pregnancy / Birth History Brendia Sacks([name], CMA; 03/09/2021 9:00 AM) Gravida  1 Maternal age  41-25 Para  0 Regular periods    Other Problems ([name], CMA; 03/09/2021 9:00 AM) No pertinent past medical history  Review of Systems ([name] CMA; 03/09/2021 9:00 AM) General Not Present- Appetite Loss, Chills, Fatigue, Fever, Night Sweats, Weight Gain and Weight Loss. Skin Not  Present- Change in Wart/Mole, Dryness, Hives, Jaundice, New Lesions, Non-Healing Wounds, Rash and Ulcer. HEENT Not Present- Earache, Hearing Loss, Hoarseness, Nose Bleed, Oral Ulcers, Ringing in the Ears, Seasonal Allergies, Sinus Pain, Sore Throat, Visual Disturbances, Wears glasses/contact lenses and Yellow Eyes. Respiratory Not Present- Bloody sputum, Chronic Cough, Difficulty Breathing, Snoring and Wheezing. Breast Not Present- Breast Mass, Breast Pain, Nipple Discharge and Skin Changes. Cardiovascular Not Present- Chest Pain, Difficulty Breathing Lying Down, Leg Cramps, Palpitations, Rapid Heart Rate, Shortness of Breath and Swelling of Extremities. Gastrointestinal Present- Hemorrhoids. Not Present- Abdominal Pain, Bloating, Bloody Stool, Change in Bowel Habits, Chronic diarrhea, Constipation, Difficulty Swallowing, Excessive gas, Gets full quickly at meals, Indigestion, Nausea, Rectal Pain and Vomiting. Female Genitourinary Not Present- Frequency, Nocturia, Painful Urination, Pelvic Pain and Urgency. Musculoskeletal Present- Joint Pain. Not Present- Back Pain, Joint Stiffness, Muscle Pain, Muscle Weakness and Swelling of Extremities. Neurological Not Present- Decreased Memory, Fainting, Headaches, Numbness, Seizures, Tingling, Tremor, Trouble walking and Weakness. Psychiatric Not Present- Anxiety, Bipolar, Change in Sleep Pattern, Depression, Fearful and Frequent crying. Endocrine Not Present- Cold Intolerance, Excessive Hunger, Hair Changes, Heat Intolerance, Hot flashes and New Diabetes. Hematology Not Present- Blood Thinners, Easy Bruising, Excessive bleeding, Gland problems, HIV and Persistent Infections.   Vitals ([name] CMA; 03/09/2021 8:59 AM) 03/09/2021 8:58 AM Weight: 142.25 lb   Height: 58.5 in  Body Surface Area: 1.59 m   Body Mass Index: 29.22 kg/m   Temp.: 98.3 F    Pulse: 99 (Regular)    P.OX: 99% (Room air) BP: 112/70(Sitting, Left Arm, Standard)      BP 116/83    Pulse 95   Temp 98.5 F (36.9 C) (Oral)   Resp 16   Ht 4\' 10"  (1.473 m)   Wt 64.5 kg   LMP 04/15/2021 (Approximate)   SpO2 99%   BMI 29.72 kg/m  04/15/2021          Physical Exam    General Mental Status - Alert. General Appearance - Not in acute distress, Not Sickly. Orientation - Oriented X3. Hydration - Well hydrated. Voice - Normal.   Integumentary Global Assessment Upon inspection and palpation of skin surfaces of the - Axillae: non-tender, no inflammation or ulceration, no drainage. and Distribution of scalp and body hair is normal. General Characteristics Temperature - normal warmth is noted.   Head and Neck Head - normocephalic, atraumatic with no lesions or palpable masses. Face Global Assessment - atraumatic, no absence of expression. Neck Global Assessment - no abnormal movements, no bruit auscultated on the right, no bruit auscultated on the left, no decreased range of motion, non-tender. Trachea - midline. Thyroid Gland Characteristics - non-tender.   Eye Eyeball - Left - Extraocular movements intact, No Nystagmus - Left. Eyeball - Right - Extraocular movements intact, No Nystagmus - Right. Cornea - Left - No Hazy - Left. Cornea - Right - No Hazy - Right. Sclera/Conjunctiva - Left - No scleral icterus, No Discharge - Left. Sclera/Conjunctiva - Right - No scleral icterus, No Discharge - Right. Pupil - Left - Direct reaction to light normal. Pupil - Right - Direct reaction to light normal.   ENMT Ears Pinna - Left - no drainage observed, no generalized tenderness observed. Pinna - Right - no drainage observed, no generalized tenderness observed. Nose and Sinuses External Inspection of the Nose - no destructive  lesion observed. Inspection of the nares - Left - quiet respiration. Inspection of the nares - Right - quiet respiration. Mouth and Throat Lips - Upper Lip - no fissures observed, no pallor noted. Lower Lip - no fissures observed, no pallor  noted. Nasopharynx - no discharge present. Oral Cavity/Oropharynx - Tongue - no dryness observed. Oral Mucosa - no cyanosis observed. Hypopharynx - no evidence of airway distress observed. Note:  Moderate ear stud piercing. Clean without inflammation or irritation   Chest and Lung Exam Inspection Movements - Normal and Symmetrical. Accessory muscles - No use of accessory muscles in breathing. Palpation Palpation of the chest reveals - Non-tender. Auscultation Breath sounds - Normal and Clear.   Cardiovascular Auscultation Rhythm - Regular. Murmurs & Other Heart Sounds - Auscultation of the heart reveals - No Murmurs and No Systolic Clicks.   Abdomen Inspection Inspection of the abdomen reveals - No Visible peristalsis and No Abnormal pulsations. Umbilicus - No Bleeding, No Urine drainage. Palpation/Percussion Palpation and Percussion of the abdomen reveal - Soft, Non Tender, No Rebound tenderness, No Rigidity (guarding) and No Cutaneous hyperesthesia. Note:  Abdomen soft.  Not severely distended.  No distasis recti.  No umbilical or other anterior abdominal wall hernias   Female Genitourinary Sexual Maturity Tanner 5 - Adult hair pattern. Note:  No vaginal bleeding nor discharge   Rectal Note:  #################################   Large right anterior prolapsed hemorrhoid   Perianal skin clean with good hygiene. No pruritis ani. No pilonidal disease. No fissure. No abscess/fistula. Normal sphincter tone. No other external hemorrhoids. No condyloma warts.   Tolerates digital and anoscopic rectal exam. No rectal masses. Hemorrhoidal piles right posterior with scarring consistent with prior banding. Left lateral mildly irritated grade 2.    Peripheral Vascular Upper Extremity Inspection - Left - No Cyanotic nailbeds - Left, Not Ischemic. Inspection - Right - No Cyanotic nailbeds - Right, Not Ischemic.   Neurologic Neurologic evaluation reveals  - normal attention span and  ability to concentrate, able to name objects and repeat phrases. Appropriate fund of knowledge , normal sensation and normal coordination. Mental Status Affect - not angry, not paranoid. Cranial Nerves - Normal Bilaterally. Gait - Normal.   Neuropsychiatric Mental status exam performed with findings of - able to articulate well with normal speech/language, rate, volume and coherence, thought content normal with ability to perform basic computations and apply abstract reasoning and no evidence of hallucinations, delusions, obsessions or homicidal/suicidal ideation.   Musculoskeletal Global Assessment Spine, Ribs and Pelvis - no instability, subluxation or laxity. Right Upper Extremity - no instability, subluxation or laxity.   Lymphatic Head & Neck   General Head & Neck Lymphatics: Bilateral - Description - No Localized lymphadenopathy. Axillary   General Axillary Region: Bilateral - Description - No Localized lymphadenopathy. Femoral & Inguinal   Generalized Femoral & Inguinal Lymphatics: Left - Description - No Localized lymphadenopathy. Right - Description - No Localized lymphadenopathy.       Assessment & Plan   PROLAPSED INTERNAL HEMORRHOIDS, GRADE 4 (K64.3) Impression: Large right anterior chronically prolapsed hemorrhoid with bleeding and irritation in the setting of chronic constipation.   She's already had a few Band-Aids done last decade. I think that reflects her right posterior scarred in area. This too far along to manage with banding alone. I think she requires surgical treatment with hemorrhoidectomy and ligation and pexy overall. Outpatient surgery. She is interested in proceeding. I did caution him that people struggle with severe pain in the first few  weeks and then it starts to taper off after a few months. She understands and wishes to proceed.   I strongly noted that this is a side effect of her chronic constipation and she needs to be in the supplement every day  and adjust so that she is going every day to minimize the chance of new hemorrhoids and also help her recover over the surgery more easily. Previous. We give information to help   The anatomy & physiology of the anorectal region was discussed.  The pathophysiology of hemorrhoids and differential diagnosis was discussed.  Natural history risks without surgery was discussed.   I stressed the importance of a bowel regimen to have daily soft bowel movements to minimize progression of disease.  Interventions such as sclerotherapy & banding were discussed.   The patient's symptoms are not adequately controlled by medicines and other non-operative treatments.  I feel the risks & problems of no surgery outweigh the operative risks; therefore, I recommended surgery to treat the hemorrhoids by ligation, pexy, and possible resection.    Risks such as bleeding, infection, urinary difficulties, need for further treatment, heart attack, death, and other risks were discussed.   I noted a good likelihood this will help address the problem.  Goals of post-operative recovery were discussed as well.  Possibility that this will not correct all symptoms was explained.  Post-operative pain, bleeding, constipation, and other problems after surgery were discussed.  We will work to minimize complications.   Educational handouts further explaining the pathology, treatment options, and bowel regimen were given as well.  Questions were answered.  The patient expresses understanding & wishes to proceed with surgery.    PCOS (POLYCYSTIC OVARIAN SYNDROME) (E28.2)     HYPERLIPEMIA (E78.5)   [name], MD, FACS, MASCRS   Esophageal, Gastrointestinal & Colorectal Surgery Robotic and Minimally Invasive Surgery [facility] [address], [address] [place], [place] [address] [phone] Fax [phone] Main/Paging   CONTACT INFORMATION: Weekday (9AM-5PM) concerns: Call [facility] main office at  [phone] Weeknight (5PM-9AM) or Weekend/Holiday concerns: Check [email or web address] for General Surgery [facility] coverage (Please, do not use SecureChat as it is not reliable communication to operating surgeons for immediate patient care)      '

## 2021-04-15 NOTE — Op Note (Addendum)
04/15/2021  10:32 AM  PATIENT:  Debra Rivas  40 y.o. female  Patient Care Team: Mitzi Hansen, NP as PCP - General (Nurse Practitioner) Karie Soda, MD as Consulting Physician (General Surgery) Edrick Kins, PA-C as Physician Assistant (Gastroenterology)  PRE-OPERATIVE DIAGNOSIS:  hemorrhoids prolapsed grade 4 with bleeding  POST-OPERATIVE DIAGNOSIS:   Hemorrhoids prolapsed grade 2-4 with bleeding  PROCEDURE:  Internal and external hemorrhoidectomy x1 Internal hemorrhoidal ligation and pexy Anorectal examination under anesthesia  SURGEON:  [name], MD  ANESTHESIA:   General Anorectal & Local field block (0.25% bupivacaine with epinephrine mixed with Liposomal bupivacaine (Experel)   EBL:  Total I/O In: -  Out: 10 [Blood:10].  See operative record  Delay start of Pharmacological VTE agent (>24hrs) due to surgical blood loss or risk of bleeding:  NO  DRAINS: NONE  SPECIMEN:   Internal & external hemorrhoid x1  DISPOSITION OF SPECIMEN:  PATHOLOGY  COUNTS:  YES  PLAN OF CARE: Discharge home after PACU  PATIENT DISPOSITION:  PACU - hemodynamically stable.  INDICATION: Pleasant patient with struggles with hemorrhoids.  Not able to be managed in the office despite an improved bowel regimen.  I recommended examination under anesthesia and surgical treatment:  The anatomy & physiology of the anorectal region was discussed.  The pathophysiology of hemorrhoids and differential diagnosis was discussed.  Natural history risks without surgery was discussed.   I stressed the importance of a bowel regimen to have daily soft bowel movements to minimize progression of disease.  Interventions such as sclerotherapy & banding were discussed.  The patient's symptoms are not adequately controlled by medicines and other non-operative treatments.  I feel the risks & problems of no surgery outweigh the operative risks; therefore, I recommended surgery to treat the  hemorrhoids by ligation, pexy, and possible resection.  Risks such as bleeding, infection, need for further treatment, heart attack, death, and other risks were discussed.   I noted a good likelihood this will help address the problem.  Goals of post-operative recovery were discussed as well.  Possibility that this will not correct all symptoms was explained.  Post-operative pain, bleeding, constipation, urinary difficulties, and other problems after surgery were discussed.  We will work to minimize complications.   Educational handouts further explaining the pathology, treatment options, and bowel regimen were given as well.  Questions were answered.  The patient expresses understanding & wishes to proceed with surgery.  OR FINDINGS: Left anterior large hemorrhoid with significant external component going up into the midline raphae.  Hemorrhoidal ligation/pexy/hemorrhoidectomy done.  Grade 2 internal hemorrhoids.  Some mild irritation suspicious for borderline grade 3.  Internal ligation/hemorrhoidal pexy done.  No fissure.  No fistula.  No abscess.  No pilonidal disease.  Mild pruritus.  Normal sphincter tone.  No obvious proctitis  DESCRIPTION:   Informed consent was confirmed. Patient underwent general anesthesia without difficulty. Patient was placed into prone positioning.  The perianal region was prepped and draped in sterile fashion. Surgical time-out confirmed our plan.  I did digital rectal examination and then transitioned over to anoscopy to get a sense of the anatomy.  Findings noted above.   I proceeded to do hemorrhoidal ligation and pexy.  I used a 2-0 Vicryl suture on a UR-6 needle in a figure-of-eight fashion 6 cm proximal to the anal verge.  I started at the largest hemorrhoid pile, left anterior.  Because of redundant hemorrhoidal tissue too bulky to merely ligate or pexy, I excised the excess internal hemorrhoid  piles longitudinally in a fusiform biconcave fashion, sparing the  anal canal to avoid narrowing.  I then ran that stitch longitudinally more distally to close the hemorrhoidectomy wound to the anal verge over a large Parks self-retaining anal retractor to avoid narrowing of the anal canal.  I then tied that stitch down to cause a hemorrhoidopexy.  I then did hemorrhoidal ligation and pexy at the other 5 hemorrhoidal columns.  At the completion of this, all 6 anorectal columns were ligated and pexied in the classic hexagonal fashion (right anterior/lateral/posterior, left anterior/lateral/posterior).  I trimmed in closed axis external hemorrhoidal and midline raphae tissue in the left anterior hemorrhoidectomy wound with radial interrupted horizontal mattress 2-0 chromic suture over a large Sawyer anal retractor, leaving the last 5 mm open to allow natural drainage.    I redid anoscopy & examination.  At completion of this, all hemorrhoids had been removed or reduced into the rectum.  There is no more prolapse.  Internal & external anatomy was more more normal.  Hemostasis was good.  Fluffed gauze was on-laid over the perianal region.  No packing done.  Patient has been extubated and is stable/comfortable in the PACU recovery room.  I had discussed postop care in detail with the patient in the preop holding area.  Instructions for post-operative recovery and prescriptions are written. I made an attempt to locate & reach the desired patient contact to discuss the patient's overall status and my recommendations.  No one is available at this time.  My plans & instructions have been written in the chart.     [name], M.D., F.A.C.S. Gastrointestinal and Minimally Invasive Surgery [facility] [address], [address] [place], [place] [address] [phone] Main / Paging

## 2021-04-15 NOTE — Progress Notes (Signed)
Up to bathroom twice to attempt to void without success after valium given.. Bladder scan revealed 477 ml urine. Dr. Michaell Cowing aware, new orders given.

## 2021-04-15 NOTE — Transfer of Care (Signed)
Immediate Anesthesia Transfer of Care Note  Patient: Debra Rivas  Procedure(s) Performed: Procedure(s) (LRB): HEMORRHOIDECTOMY WITH LIGATION AND HEMORRHOIDOPEXY (N/A) ANORECTAL EXAMINATION UNDER ANESTHESIA (N/A)  Patient Location: PACU  Anesthesia Type: General  Level of Consciousness: awake, sedated, patient cooperative and responds to stimulation  Airway & Oxygen Therapy: Patient Spontanous Breathing and Patient connected to [place] to FM assisting with ambu bag briefly 2-3 minutes to improve Sa02 . Improved to 96 % Pt awake c/o pain   Post-op Assessment: Report given to PACU RN, Post -op Vital signs reviewed and stable and Patient moving all extremities  Post vital signs: Reviewed and stable  Complications: No apparent anesthesia complications

## 2021-04-15 NOTE — Anesthesia Procedure Notes (Signed)
Procedure Name: Intubation Date/Time: 04/15/2021 9:11 AM Performed by: Justice Rocher, CRNA Pre-anesthesia Checklist: Patient identified, Emergency Drugs available, Suction available, Patient being monitored and Timeout performed Patient Re-evaluated:Patient Re-evaluated prior to induction Oxygen Delivery Method: Circle system utilized Preoxygenation: Pre-oxygenation with 100% oxygen Induction Type: IV induction Ventilation: Mask ventilation without difficulty Laryngoscope Size: Mac and 3 Grade View: Grade II Tube type: Oral Tube size: 7.0 mm Number of attempts: 1 Airway Equipment and Method: Stylet and Oral airway Placement Confirmation: ETT inserted through vocal cords under direct vision, positive ETCO2, breath sounds checked- equal and bilateral and CO2 detector Secured at: 23 cm Tube secured with: Tape Dental Injury: Teeth and Oropharynx as per pre-operative assessment

## 2021-04-15 NOTE — Progress Notes (Signed)
I located & discussed operative findings, updated the patient's status, discussed probable steps to recovery, and gave postoperative recommendations to the patient's mother .  She had not turned her pone blocker off.  Recommendations were made.  Questions were answered.  She expressed understanding & appreciation.   [name], MD, FACS, MASCRS Esophageal, Gastrointestinal & Colorectal Surgery Robotic and Minimally Invasive Surgery  [facility] [address], [address] [place], [place] [address] [phone] Fax [phone] Main/Paging  CONTACT INFORMATION: Weekday (9AM-5PM) concerns: Call [facility] main office at [phone] Weeknight (5PM-9AM) or Weekend/Holiday concerns: Check [email or web address] for General Surgery [facility] coverage (Please, do not use SecureChat as it is not reliable communication to operating surgeons for immediate patient care)

## 2021-04-15 NOTE — Anesthesia Preprocedure Evaluation (Signed)
Anesthesia Evaluation  Patient identified by MRN, date of birth, ID band Patient awake    Reviewed: Allergy & Precautions, NPO status   History of Anesthesia Complications (+) PONV  Airway Mallampati: II  TM Distance: >3 FB     Dental   Pulmonary    breath sounds clear to auscultation       Cardiovascular negative cardio ROS   Rhythm:Regular Rate:Normal     Neuro/Psych  Headaches,    GI/Hepatic negative GI ROS, Neg liver ROS,   Endo/Other  negative endocrine ROS  Renal/GU negative Renal ROS     Musculoskeletal   Abdominal   Peds  Hematology   Anesthesia Other Findings   Reproductive/Obstetrics                             Anesthesia Physical Anesthesia Plan  ASA: 2  Anesthesia Plan: General   Post-op Pain Management:    Induction:   PONV Risk Score and Plan: 4 or greater and Ondansetron, Dexamethasone and Midazolam  Airway Management Planned: LMA  Additional Equipment:   Intra-op Plan:   Post-operative Plan: Extubation in OR  Informed Consent: I have reviewed the patients History and Physical, chart, labs and discussed the procedure including the risks, benefits and alternatives for the proposed anesthesia with the patient or authorized representative who has indicated his/her understanding and acceptance.     Dental advisory given  Plan Discussed with: CRNA  Anesthesia Plan Comments:         Anesthesia Quick Evaluation

## 2021-04-15 NOTE — Progress Notes (Signed)
Third attempt to void without success. 14 Fr foley cath placed without difficulty to return large amt clear yellow urine. Caryn Section RN present as second Charity fundraiser. Pt tolerated well and home care instructions reviewed. To remove per self Monday, April 19, 2021.

## 2021-04-15 NOTE — Discharge Instructions (Addendum)
  Post Anesthesia Home Care Instructions  Activity: Get plenty of rest for the remainder of the day. A responsible individual must stay with you for 24 hours following the procedure.  For the next 24 hours, DO NOT: -Drive a car -Advertising copywriter -Drink alcoholic beverages -Take any medication unless instructed by your physician -Make any legal decisions or sign important papers.  Meals: Start with liquid foods such as gelatin or soup. Progress to regular foods as tolerated. Avoid greasy, spicy, heavy foods. If nausea and/or vomiting occur, drink only clear liquids until the nausea and/or vomiting subsides. Call your physician if vomiting continues.  Special Instructions/Symptoms: Your throat may feel dry or sore from the anesthesia or the breathing tube placed in your throat during surgery. If this causes discomfort, gargle with warm salt water. The discomfort should disappear within 24 hours.  If you had a scopolamine patch placed behind your ear for the management of post- operative nausea and/or vomiting:  1. The medication in the patch is effective for 72 hours, after which it should be removed.  Wrap patch in a tissue and discard in the trash. Wash hands thoroughly with soap and water. 2. You may remove the patch earlier than 72 hours if you experience unpleasant side effects which may include dry mouth, dizziness or visual disturbances. 3. Avoid touching the patch. Wash your hands with soap and water after contact with the patch.  Remove patch behind left ear by Sunday, April 18, 2021.  Information for Discharge Teaching: EXPAREL (bupivacaine liposome injectable suspension)   Your surgeon or anesthesiologist gave you EXPAREL(bupivacaine) to help control your pain after surgery.  EXPAREL is a local anesthetic that provides pain relief by numbing the tissue around the surgical site. EXPAREL is designed to release pain medication over time and can control pain for up to 72  hours. Depending on how you respond to EXPAREL, you may require less pain medication during your recovery.  Possible side effects: Temporary loss of sensation or ability to move in the area where bupivacaine was injected. Nausea, vomiting, constipation Rarely, numbness and tingling in your mouth or lips, lightheadedness, or anxiety may occur. Call your doctor right away if you think you may be experiencing any of these sensations, or if you have other questions regarding possible side effects.  Follow all other discharge instructions given to you by your surgeon or nurse. Eat a healthy diet and drink plenty of water or other fluids.  If you return to the hospital for any reason within 96 hours following the administration of EXPAREL, it is important for health care providers to know that you have received this anesthetic. A teal colored band has been placed on your arm with the date, time and amount of EXPAREL you have received in order to alert and inform your health care providers. Please leave this armband in place for the full 96 hours following administration, and then you may remove the band.   Do not remove green arm band before  Monday, April 19, 2021.

## 2021-04-16 LAB — SURGICAL PATHOLOGY

## 2021-04-19 ENCOUNTER — Encounter (HOSPITAL_BASED_OUTPATIENT_CLINIC_OR_DEPARTMENT_OTHER): Payer: Self-pay | Admitting: Surgery

## 2021-04-22 DIAGNOSIS — E785 Hyperlipidemia, unspecified: Secondary | ICD-10-CM | POA: Diagnosis not present

## 2021-04-22 DIAGNOSIS — B029 Zoster without complications: Secondary | ICD-10-CM | POA: Diagnosis not present

## 2021-06-24 DIAGNOSIS — Z23 Encounter for immunization: Secondary | ICD-10-CM | POA: Diagnosis not present

## 2021-06-24 DIAGNOSIS — M25551 Pain in right hip: Secondary | ICD-10-CM | POA: Diagnosis not present

## 2021-07-06 DIAGNOSIS — M25551 Pain in right hip: Secondary | ICD-10-CM | POA: Diagnosis not present

## 2021-11-18 DIAGNOSIS — Z0000 Encounter for general adult medical examination without abnormal findings: Secondary | ICD-10-CM | POA: Diagnosis not present

## 2021-11-18 DIAGNOSIS — E785 Hyperlipidemia, unspecified: Secondary | ICD-10-CM | POA: Diagnosis not present

## 2021-11-18 DIAGNOSIS — Z131 Encounter for screening for diabetes mellitus: Secondary | ICD-10-CM | POA: Diagnosis not present

## 2022-01-31 ENCOUNTER — Other Ambulatory Visit: Payer: Self-pay | Admitting: Nurse Practitioner

## 2022-01-31 DIAGNOSIS — Z1231 Encounter for screening mammogram for malignant neoplasm of breast: Secondary | ICD-10-CM

## 2022-03-24 ENCOUNTER — Ambulatory Visit (INDEPENDENT_AMBULATORY_CARE_PROVIDER_SITE_OTHER): Payer: BC Managed Care – PPO

## 2022-03-24 DIAGNOSIS — Z1231 Encounter for screening mammogram for malignant neoplasm of breast: Secondary | ICD-10-CM

## 2022-03-24 DIAGNOSIS — T148XXA Other injury of unspecified body region, initial encounter: Secondary | ICD-10-CM | POA: Diagnosis not present

## 2022-03-30 ENCOUNTER — Other Ambulatory Visit: Payer: Self-pay | Admitting: Family Medicine

## 2022-03-30 ENCOUNTER — Other Ambulatory Visit: Payer: Self-pay | Admitting: Nurse Practitioner

## 2022-03-30 DIAGNOSIS — R928 Other abnormal and inconclusive findings on diagnostic imaging of breast: Secondary | ICD-10-CM

## 2022-04-05 ENCOUNTER — Ambulatory Visit
Admission: RE | Admit: 2022-04-05 | Discharge: 2022-04-05 | Disposition: A | Discharge status: Home / Self Care | Payer: BC Managed Care – PPO | Source: Ambulatory Visit | Attending: Nurse Practitioner | Admitting: Nurse Practitioner

## 2022-04-05 ENCOUNTER — Ambulatory Visit: Payer: BC Managed Care – PPO

## 2022-04-05 DIAGNOSIS — R928 Other abnormal and inconclusive findings on diagnostic imaging of breast: Secondary | ICD-10-CM

## 2022-04-05 DIAGNOSIS — R922 Inconclusive mammogram: Secondary | ICD-10-CM | POA: Diagnosis not present

## 2022-04-29 DIAGNOSIS — M25512 Pain in left shoulder: Secondary | ICD-10-CM | POA: Diagnosis not present

## 2022-06-30 DIAGNOSIS — J069 Acute upper respiratory infection, unspecified: Secondary | ICD-10-CM | POA: Diagnosis not present

## 2022-09-14 DIAGNOSIS — R5382 Chronic fatigue, unspecified: Secondary | ICD-10-CM | POA: Diagnosis not present

## 2022-09-14 DIAGNOSIS — G43009 Migraine without aura, not intractable, without status migrainosus: Secondary | ICD-10-CM | POA: Diagnosis not present

## 2022-09-14 DIAGNOSIS — K5900 Constipation, unspecified: Secondary | ICD-10-CM | POA: Diagnosis not present

## 2022-09-14 DIAGNOSIS — Z3202 Encounter for pregnancy test, result negative: Secondary | ICD-10-CM | POA: Diagnosis not present

## 2022-09-14 DIAGNOSIS — E669 Obesity, unspecified: Secondary | ICD-10-CM | POA: Diagnosis not present

## 2022-09-16 DIAGNOSIS — Z724 Inappropriate diet and eating habits: Secondary | ICD-10-CM | POA: Diagnosis not present

## 2022-09-16 DIAGNOSIS — Z713 Dietary counseling and surveillance: Secondary | ICD-10-CM | POA: Diagnosis not present

## 2022-09-16 DIAGNOSIS — Z723 Lack of physical exercise: Secondary | ICD-10-CM | POA: Diagnosis not present

## 2022-09-23 DIAGNOSIS — K5900 Constipation, unspecified: Secondary | ICD-10-CM | POA: Diagnosis not present

## 2022-09-23 DIAGNOSIS — E669 Obesity, unspecified: Secondary | ICD-10-CM | POA: Diagnosis not present

## 2022-09-23 DIAGNOSIS — R5382 Chronic fatigue, unspecified: Secondary | ICD-10-CM | POA: Diagnosis not present

## 2022-09-23 DIAGNOSIS — E785 Hyperlipidemia, unspecified: Secondary | ICD-10-CM | POA: Diagnosis not present

## 2022-09-30 DIAGNOSIS — E669 Obesity, unspecified: Secondary | ICD-10-CM | POA: Diagnosis not present

## 2022-09-30 DIAGNOSIS — K5900 Constipation, unspecified: Secondary | ICD-10-CM | POA: Diagnosis not present

## 2022-09-30 DIAGNOSIS — E785 Hyperlipidemia, unspecified: Secondary | ICD-10-CM | POA: Diagnosis not present

## 2022-09-30 DIAGNOSIS — R5382 Chronic fatigue, unspecified: Secondary | ICD-10-CM | POA: Diagnosis not present

## 2022-10-14 DIAGNOSIS — R5382 Chronic fatigue, unspecified: Secondary | ICD-10-CM | POA: Diagnosis not present

## 2022-10-14 DIAGNOSIS — K5900 Constipation, unspecified: Secondary | ICD-10-CM | POA: Diagnosis not present

## 2022-10-14 DIAGNOSIS — E669 Obesity, unspecified: Secondary | ICD-10-CM | POA: Diagnosis not present

## 2022-10-14 DIAGNOSIS — E785 Hyperlipidemia, unspecified: Secondary | ICD-10-CM | POA: Diagnosis not present

## 2022-10-14 DIAGNOSIS — Z3202 Encounter for pregnancy test, result negative: Secondary | ICD-10-CM | POA: Diagnosis not present

## 2022-10-26 DIAGNOSIS — E669 Obesity, unspecified: Secondary | ICD-10-CM | POA: Diagnosis not present

## 2022-10-26 DIAGNOSIS — E785 Hyperlipidemia, unspecified: Secondary | ICD-10-CM | POA: Diagnosis not present

## 2022-10-26 DIAGNOSIS — R5382 Chronic fatigue, unspecified: Secondary | ICD-10-CM | POA: Diagnosis not present

## 2022-10-26 DIAGNOSIS — K5900 Constipation, unspecified: Secondary | ICD-10-CM | POA: Diagnosis not present

## 2022-11-02 DIAGNOSIS — K5900 Constipation, unspecified: Secondary | ICD-10-CM | POA: Diagnosis not present

## 2022-11-02 DIAGNOSIS — E785 Hyperlipidemia, unspecified: Secondary | ICD-10-CM | POA: Diagnosis not present

## 2022-11-02 DIAGNOSIS — R5382 Chronic fatigue, unspecified: Secondary | ICD-10-CM | POA: Diagnosis not present

## 2022-11-02 DIAGNOSIS — E669 Obesity, unspecified: Secondary | ICD-10-CM | POA: Diagnosis not present

## 2022-11-09 DIAGNOSIS — K5900 Constipation, unspecified: Secondary | ICD-10-CM | POA: Diagnosis not present

## 2022-11-09 DIAGNOSIS — E669 Obesity, unspecified: Secondary | ICD-10-CM | POA: Diagnosis not present

## 2022-11-09 DIAGNOSIS — R5382 Chronic fatigue, unspecified: Secondary | ICD-10-CM | POA: Diagnosis not present

## 2022-11-09 DIAGNOSIS — E785 Hyperlipidemia, unspecified: Secondary | ICD-10-CM | POA: Diagnosis not present

## 2022-11-11 DIAGNOSIS — N134 Hydroureter: Secondary | ICD-10-CM | POA: Diagnosis not present

## 2022-11-11 DIAGNOSIS — N83201 Unspecified ovarian cyst, right side: Secondary | ICD-10-CM | POA: Diagnosis not present

## 2022-11-11 DIAGNOSIS — N838 Other noninflammatory disorders of ovary, fallopian tube and broad ligament: Secondary | ICD-10-CM | POA: Diagnosis not present

## 2022-11-11 DIAGNOSIS — R102 Pelvic and perineal pain: Secondary | ICD-10-CM | POA: Diagnosis not present

## 2022-11-11 DIAGNOSIS — R1909 Other intra-abdominal and pelvic swelling, mass and lump: Secondary | ICD-10-CM | POA: Diagnosis not present

## 2022-11-11 DIAGNOSIS — R1032 Left lower quadrant pain: Secondary | ICD-10-CM | POA: Diagnosis not present

## 2022-11-11 DIAGNOSIS — Z20822 Contact with and (suspected) exposure to covid-19: Secondary | ICD-10-CM | POA: Diagnosis not present

## 2022-11-11 DIAGNOSIS — N83202 Unspecified ovarian cyst, left side: Secondary | ICD-10-CM | POA: Diagnosis not present

## 2022-11-18 DIAGNOSIS — N838 Other noninflammatory disorders of ovary, fallopian tube and broad ligament: Secondary | ICD-10-CM | POA: Diagnosis not present

## 2022-11-18 DIAGNOSIS — R1900 Intra-abdominal and pelvic swelling, mass and lump, unspecified site: Secondary | ICD-10-CM | POA: Diagnosis not present

## 2022-11-23 DIAGNOSIS — E785 Hyperlipidemia, unspecified: Secondary | ICD-10-CM | POA: Diagnosis not present

## 2022-11-23 DIAGNOSIS — E669 Obesity, unspecified: Secondary | ICD-10-CM | POA: Diagnosis not present

## 2022-11-23 DIAGNOSIS — R5382 Chronic fatigue, unspecified: Secondary | ICD-10-CM | POA: Diagnosis not present

## 2022-11-23 DIAGNOSIS — K5900 Constipation, unspecified: Secondary | ICD-10-CM | POA: Diagnosis not present

## 2022-11-28 DIAGNOSIS — L0292 Furuncle, unspecified: Secondary | ICD-10-CM | POA: Diagnosis not present

## 2022-11-28 DIAGNOSIS — Z6827 Body mass index (BMI) 27.0-27.9, adult: Secondary | ICD-10-CM | POA: Diagnosis not present

## 2022-12-14 DIAGNOSIS — Z3202 Encounter for pregnancy test, result negative: Secondary | ICD-10-CM | POA: Diagnosis not present

## 2022-12-14 DIAGNOSIS — E785 Hyperlipidemia, unspecified: Secondary | ICD-10-CM | POA: Diagnosis not present

## 2022-12-14 DIAGNOSIS — E669 Obesity, unspecified: Secondary | ICD-10-CM | POA: Diagnosis not present

## 2022-12-14 DIAGNOSIS — K5900 Constipation, unspecified: Secondary | ICD-10-CM | POA: Diagnosis not present

## 2022-12-14 DIAGNOSIS — R5382 Chronic fatigue, unspecified: Secondary | ICD-10-CM | POA: Diagnosis not present

## 2022-12-23 DIAGNOSIS — N80123 Deep endometriosis of bilateral ovaries: Secondary | ICD-10-CM | POA: Diagnosis not present

## 2022-12-23 DIAGNOSIS — R1900 Intra-abdominal and pelvic swelling, mass and lump, unspecified site: Secondary | ICD-10-CM | POA: Diagnosis not present

## 2022-12-23 DIAGNOSIS — R1909 Other intra-abdominal and pelvic swelling, mass and lump: Secondary | ICD-10-CM | POA: Diagnosis not present

## 2022-12-23 DIAGNOSIS — N80103 Endometriosis of bilateral ovaries, unspecified depth: Secondary | ICD-10-CM | POA: Diagnosis not present

## 2022-12-26 DIAGNOSIS — R1900 Intra-abdominal and pelvic swelling, mass and lump, unspecified site: Secondary | ICD-10-CM | POA: Diagnosis not present

## 2022-12-28 DIAGNOSIS — R1900 Intra-abdominal and pelvic swelling, mass and lump, unspecified site: Secondary | ICD-10-CM | POA: Diagnosis not present

## 2022-12-28 DIAGNOSIS — Z79899 Other long term (current) drug therapy: Secondary | ICD-10-CM | POA: Diagnosis not present

## 2022-12-30 DIAGNOSIS — Z9189 Other specified personal risk factors, not elsewhere classified: Secondary | ICD-10-CM | POA: Diagnosis not present

## 2022-12-30 DIAGNOSIS — F418 Other specified anxiety disorders: Secondary | ICD-10-CM | POA: Diagnosis not present

## 2023-01-03 DIAGNOSIS — Z8742 Personal history of other diseases of the female genital tract: Secondary | ICD-10-CM | POA: Diagnosis not present

## 2023-01-03 DIAGNOSIS — D259 Leiomyoma of uterus, unspecified: Secondary | ICD-10-CM | POA: Diagnosis not present

## 2023-01-03 DIAGNOSIS — F419 Anxiety disorder, unspecified: Secondary | ICD-10-CM | POA: Diagnosis not present

## 2023-01-03 DIAGNOSIS — E282 Polycystic ovarian syndrome: Secondary | ICD-10-CM | POA: Diagnosis not present

## 2023-01-03 DIAGNOSIS — N8003 Adenomyosis of the uterus: Secondary | ICD-10-CM | POA: Diagnosis not present

## 2023-01-03 DIAGNOSIS — D269 Other benign neoplasm of uterus, unspecified: Secondary | ICD-10-CM | POA: Diagnosis not present

## 2023-01-03 DIAGNOSIS — E781 Pure hyperglyceridemia: Secondary | ICD-10-CM | POA: Diagnosis not present

## 2023-01-03 DIAGNOSIS — I10 Essential (primary) hypertension: Secondary | ICD-10-CM | POA: Diagnosis not present

## 2023-01-03 DIAGNOSIS — N736 Female pelvic peritoneal adhesions (postinfective): Secondary | ICD-10-CM | POA: Diagnosis not present

## 2023-01-03 DIAGNOSIS — N80123 Deep endometriosis of bilateral ovaries: Secondary | ICD-10-CM | POA: Diagnosis not present

## 2023-01-03 DIAGNOSIS — N80322 Deep endometriosis of the posterior cul-de-sac: Secondary | ICD-10-CM | POA: Diagnosis not present

## 2023-01-03 DIAGNOSIS — R1900 Intra-abdominal and pelvic swelling, mass and lump, unspecified site: Secondary | ICD-10-CM | POA: Diagnosis not present

## 2023-01-03 DIAGNOSIS — E782 Mixed hyperlipidemia: Secondary | ICD-10-CM | POA: Diagnosis not present

## 2023-01-03 DIAGNOSIS — G8918 Other acute postprocedural pain: Secondary | ICD-10-CM | POA: Diagnosis not present

## 2023-01-03 DIAGNOSIS — D251 Intramural leiomyoma of uterus: Secondary | ICD-10-CM | POA: Diagnosis not present

## 2023-01-03 DIAGNOSIS — D252 Subserosal leiomyoma of uterus: Secondary | ICD-10-CM | POA: Diagnosis not present

## 2023-01-03 DIAGNOSIS — Z90710 Acquired absence of both cervix and uterus: Secondary | ICD-10-CM | POA: Diagnosis not present

## 2023-01-03 DIAGNOSIS — N80103 Endometriosis of bilateral ovaries, unspecified depth: Secondary | ICD-10-CM | POA: Diagnosis not present

## 2023-01-20 DIAGNOSIS — Z79890 Hormone replacement therapy (postmenopausal): Secondary | ICD-10-CM | POA: Diagnosis not present

## 2023-01-20 DIAGNOSIS — E8940 Asymptomatic postprocedural ovarian failure: Secondary | ICD-10-CM | POA: Diagnosis not present

## 2023-01-20 DIAGNOSIS — R1900 Intra-abdominal and pelvic swelling, mass and lump, unspecified site: Secondary | ICD-10-CM | POA: Diagnosis not present

## 2023-02-07 DIAGNOSIS — R1900 Intra-abdominal and pelvic swelling, mass and lump, unspecified site: Secondary | ICD-10-CM | POA: Diagnosis not present

## 2023-02-07 DIAGNOSIS — E8940 Asymptomatic postprocedural ovarian failure: Secondary | ICD-10-CM | POA: Diagnosis not present

## 2023-03-08 DIAGNOSIS — E669 Obesity, unspecified: Secondary | ICD-10-CM | POA: Diagnosis not present

## 2023-03-08 DIAGNOSIS — K5900 Constipation, unspecified: Secondary | ICD-10-CM | POA: Diagnosis not present

## 2023-03-08 DIAGNOSIS — E785 Hyperlipidemia, unspecified: Secondary | ICD-10-CM | POA: Diagnosis not present

## 2023-03-08 DIAGNOSIS — G43009 Migraine without aura, not intractable, without status migrainosus: Secondary | ICD-10-CM | POA: Diagnosis not present

## 2023-03-13 DIAGNOSIS — E8940 Asymptomatic postprocedural ovarian failure: Secondary | ICD-10-CM | POA: Diagnosis not present

## 2023-03-13 DIAGNOSIS — L708 Other acne: Secondary | ICD-10-CM | POA: Diagnosis not present

## 2023-03-13 DIAGNOSIS — Z90710 Acquired absence of both cervix and uterus: Secondary | ICD-10-CM | POA: Diagnosis not present

## 2023-03-13 DIAGNOSIS — R1900 Intra-abdominal and pelvic swelling, mass and lump, unspecified site: Secondary | ICD-10-CM | POA: Diagnosis not present

## 2023-03-13 DIAGNOSIS — Z8742 Personal history of other diseases of the female genital tract: Secondary | ICD-10-CM | POA: Diagnosis not present

## 2023-03-13 DIAGNOSIS — Z79890 Hormone replacement therapy (postmenopausal): Secondary | ICD-10-CM | POA: Diagnosis not present

## 2023-03-15 DIAGNOSIS — K5900 Constipation, unspecified: Secondary | ICD-10-CM | POA: Diagnosis not present

## 2023-03-15 DIAGNOSIS — R5382 Chronic fatigue, unspecified: Secondary | ICD-10-CM | POA: Diagnosis not present

## 2023-03-15 DIAGNOSIS — E785 Hyperlipidemia, unspecified: Secondary | ICD-10-CM | POA: Diagnosis not present

## 2023-03-15 DIAGNOSIS — E669 Obesity, unspecified: Secondary | ICD-10-CM | POA: Diagnosis not present

## 2024-05-12 IMAGING — MG MM DIGITAL SCREENING BILAT W/ TOMO AND CAD
8 series · 8 of 24 positions shown · non-contrast
Comparison: None.

CLINICAL DATA: Screening. Baseline exam.

EXAM:
DIGITAL SCREENING BILATERAL MAMMOGRAM WITH TOMOSYNTHESIS AND CAD
TECHNIQUE: Bilateral screening digital craniocaudal and mediolateral oblique
mammograms were obtained. Bilateral screening digital breast
tomosynthesis was performed. The images were evaluated with
computer-aided detection.

[R CC synth-2D]
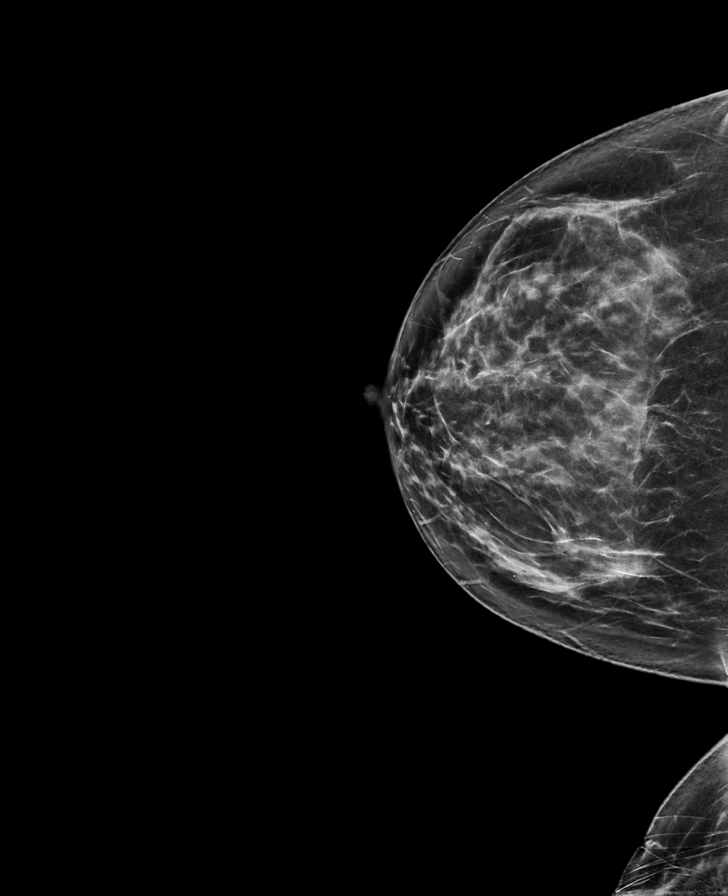

[R MLO synth-2D]
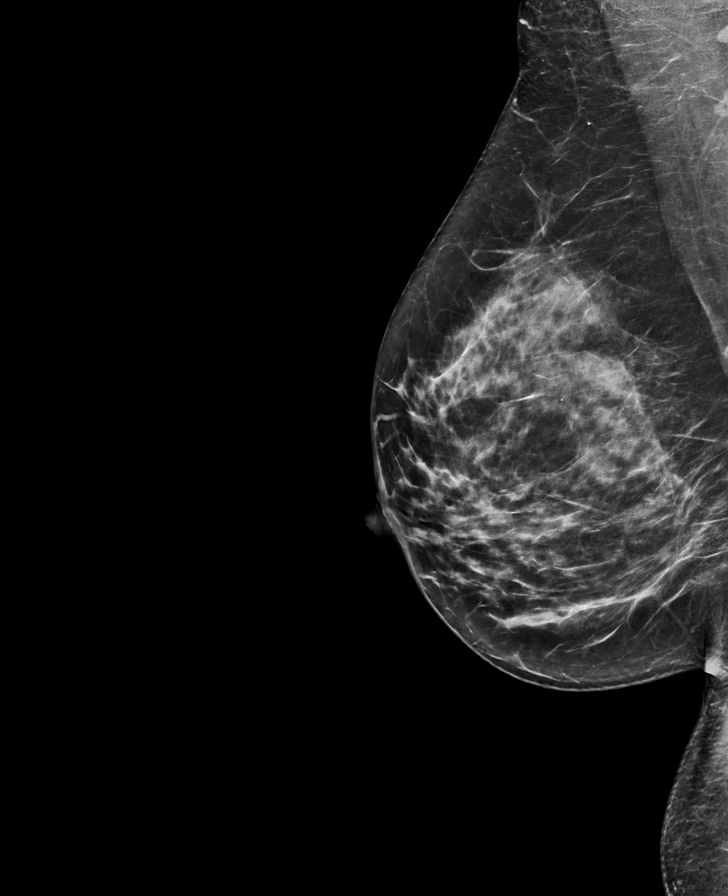

[L CC synth-2D]
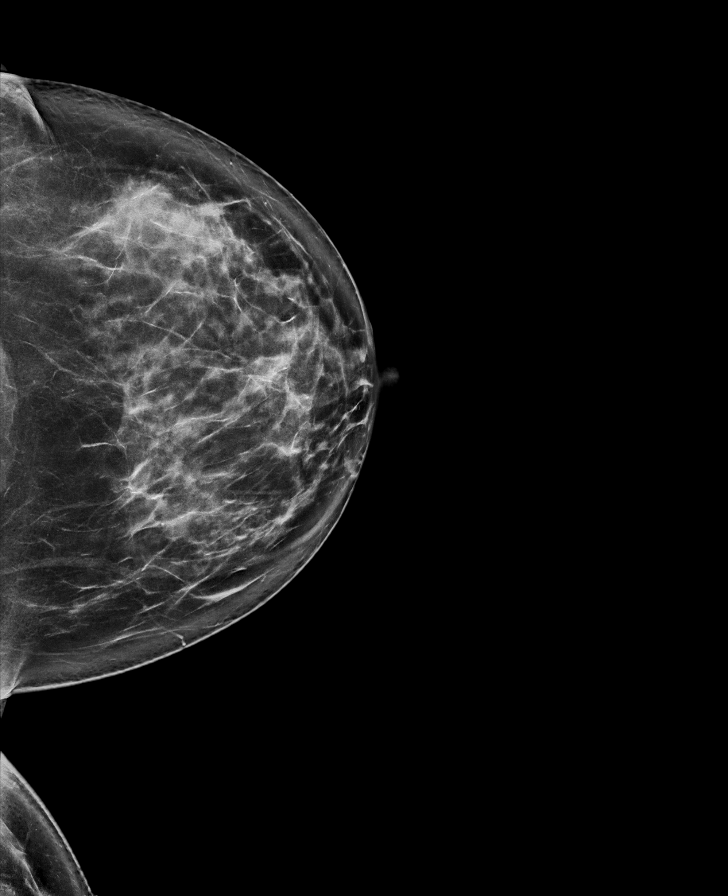

[L MLO synth-2D]
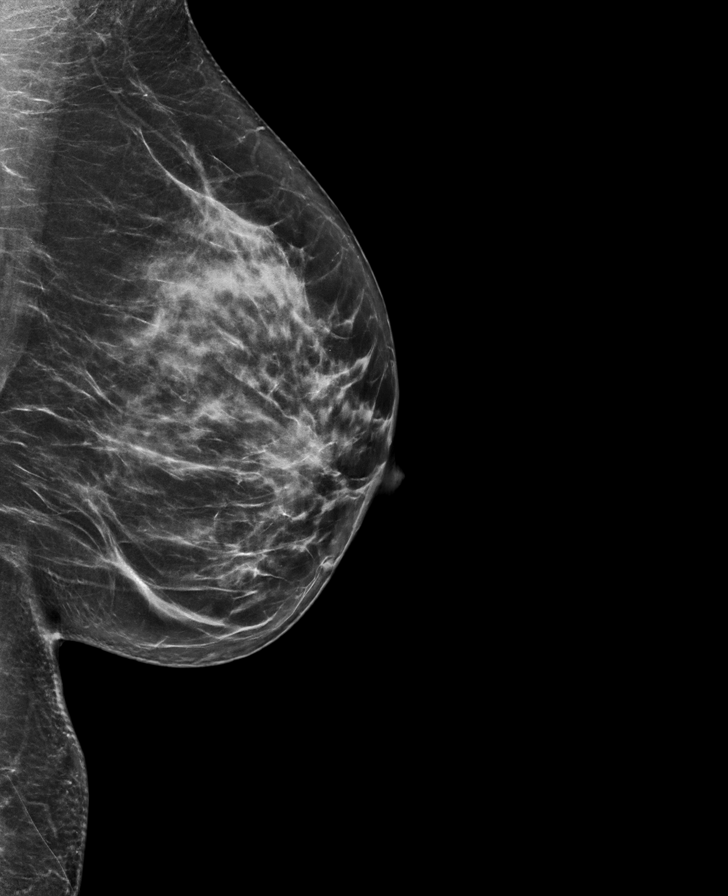

[L MLO tomo · tomo slice 39/76.0]
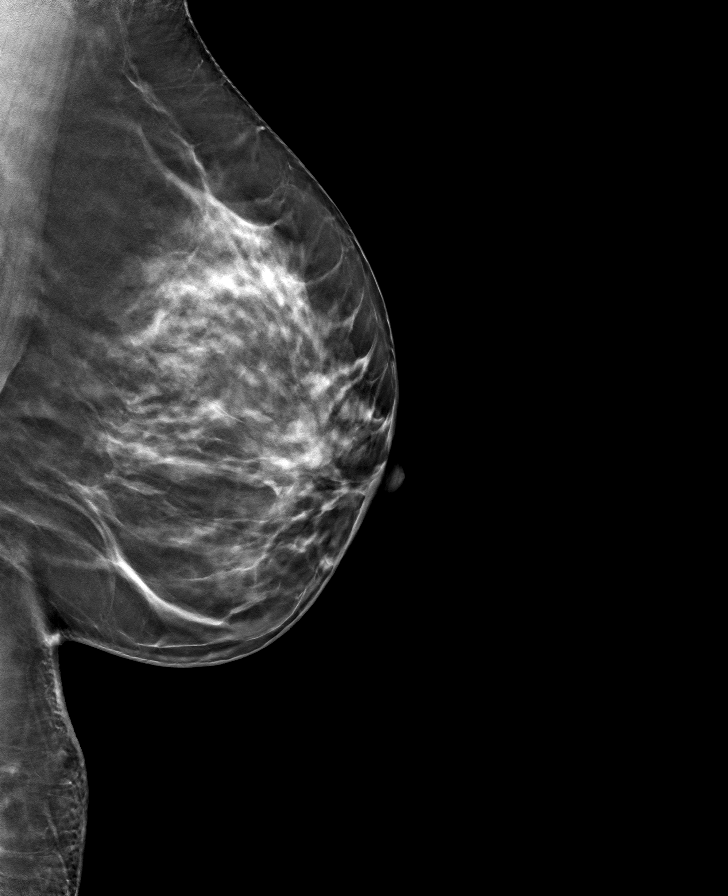

[R MLO tomo · tomo slice 37/74.0]
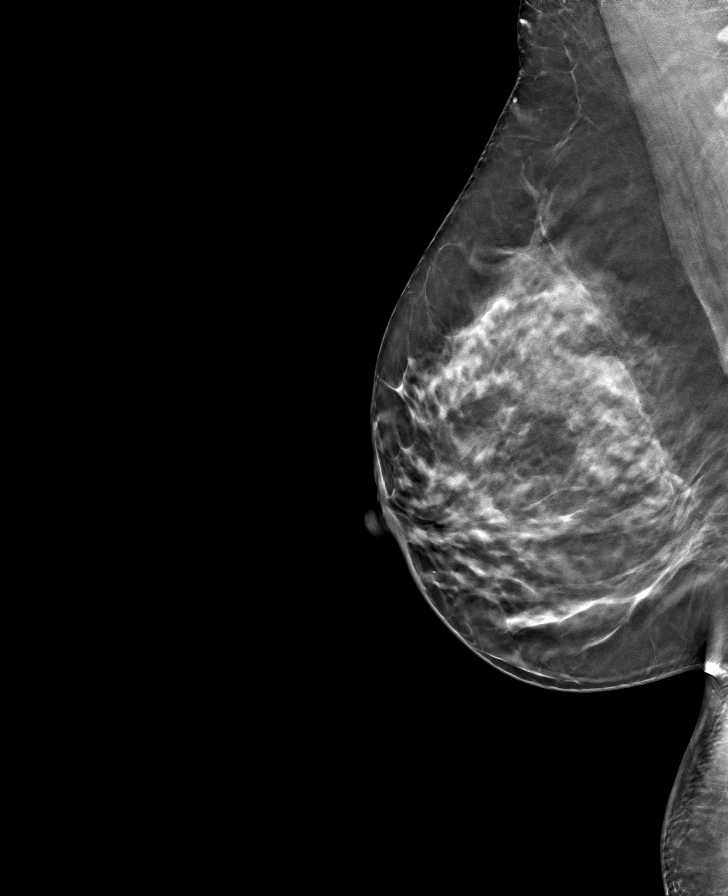

[L CC tomo · tomo slice 43/86.0]
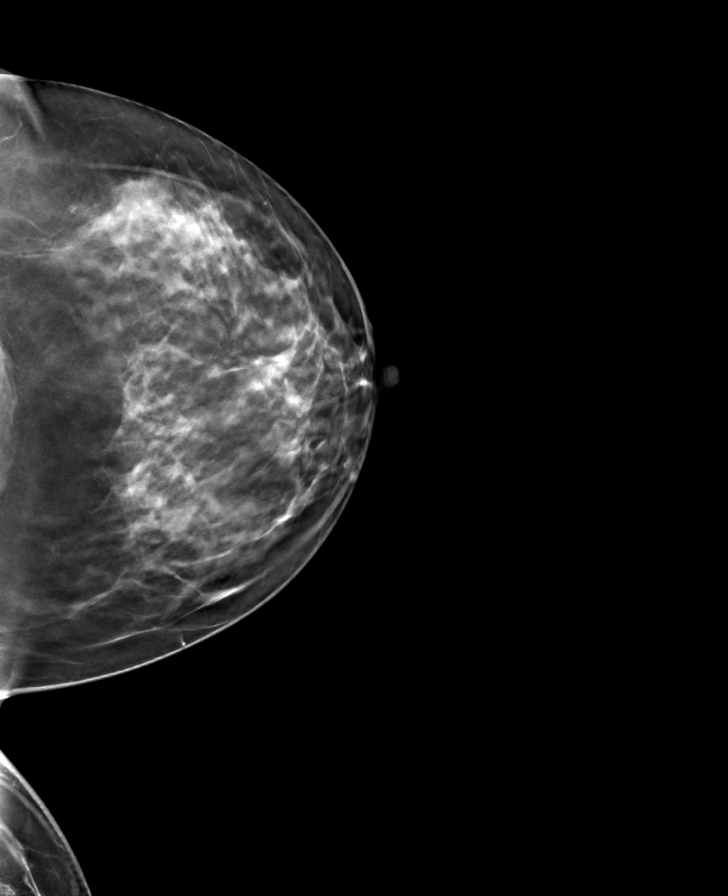

[R CC tomo · tomo slice 39/76.0]
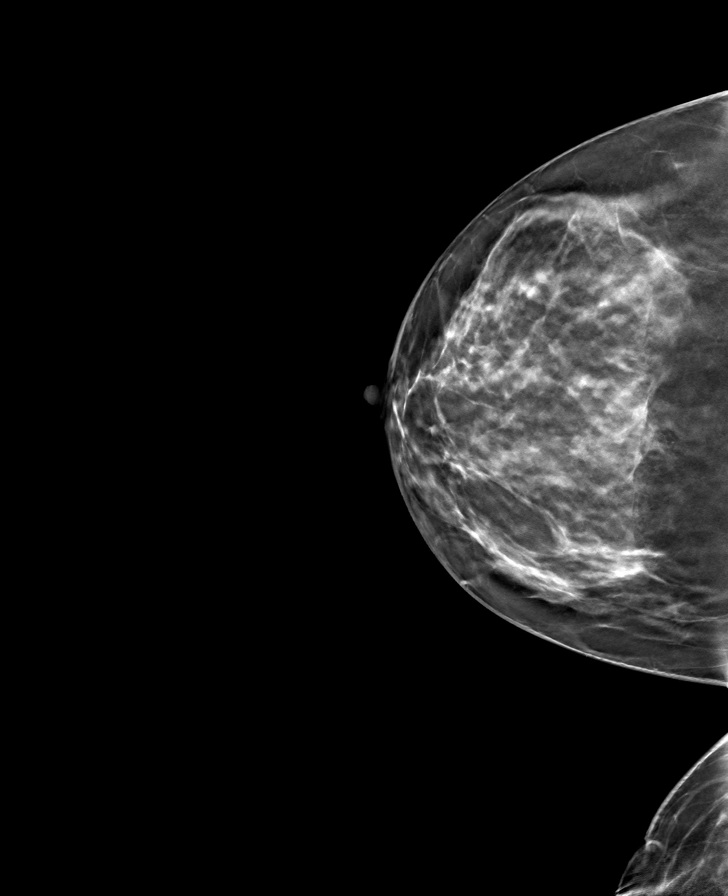

[8 of 24 positions shown; findings below may reference images not displayed]

ACR Breast Density Category c: The breast tissue is heterogeneously
dense, which may obscure small masses.
FINDINGS: In the left breast, a possible asymmetry warrants further
evaluation. In the right breast, no findings suspicious for
malignancy.
IMPRESSION: Further evaluation is suggested for possible asymmetry in the left
breast.

RECOMMENDATION:
Diagnostic mammogram and possibly ultrasound of the left breast.
(Code:AL-[id number])

The patient will be contacted regarding the findings, and additional
imaging will be scheduled.

BI-RADS CATEGORY  0: Incomplete. Need additional imaging evaluation
and/or prior mammograms for comparison.
